# Patient Record
Sex: Female | Born: 1960 | Race: White | Hispanic: No | Marital: Married | Smoking: Never smoker
Health system: Southern US, Community
[De-identification: ages and names within clinical notes are randomized; demographics above are authoritative.]

## PROBLEM LIST (undated history)

## (undated) DIAGNOSIS — I10 Essential (primary) hypertension: Secondary | ICD-10-CM

## (undated) DIAGNOSIS — M1990 Unspecified osteoarthritis, unspecified site: Secondary | ICD-10-CM

## (undated) HISTORY — DX: Unspecified osteoarthritis, unspecified site: M19.90

## (undated) HISTORY — DX: Essential (primary) hypertension: I10

---

## 2000-08-31 HISTORY — PX: OTHER SURGICAL HISTORY: SHX169

## 2004-11-24 ENCOUNTER — Ambulatory Visit: Payer: Self-pay

## 2006-01-07 ENCOUNTER — Ambulatory Visit: Payer: Self-pay

## 2007-01-17 ENCOUNTER — Ambulatory Visit: Payer: Self-pay

## 2009-01-25 ENCOUNTER — Ambulatory Visit: Payer: Self-pay | Admitting: Internal Medicine

## 2009-02-18 ENCOUNTER — Ambulatory Visit: Payer: Self-pay | Admitting: Unknown Physician Specialty

## 2010-06-26 ENCOUNTER — Ambulatory Visit: Payer: Self-pay | Admitting: Family Medicine

## 2011-09-02 LAB — LIPID PANEL
Cholesterol: 168 mg/dL (ref 0–200)
HDL: 61 mg/dL (ref 35–70)
LDL Cholesterol: 86 mg/dL

## 2012-07-01 LAB — HM MAMMOGRAPHY: HM MAMMO: NORMAL

## 2012-07-01 LAB — HM PAP SMEAR: HM Pap smear: NORMAL

## 2012-08-31 HISTORY — PX: COLONOSCOPY: SHX174

## 2012-09-01 LAB — HM COLONOSCOPY: HM COLON: NORMAL

## 2013-08-09 LAB — TSH: TSH: 2.5 u[IU]/mL (ref <=5.90)

## 2013-08-09 LAB — BASIC METABOLIC PANEL
BUN: 16 mg/dL (ref 4–21)
Creatinine: 0.7 mg/dL (ref <=1.1)

## 2013-08-09 LAB — CBC AND DIFFERENTIAL: HEMOGLOBIN: 14.8 g/dL (ref 12.0–16.0)

## 2015-03-13 ENCOUNTER — Other Ambulatory Visit: Payer: Self-pay | Admitting: Internal Medicine

## 2015-03-13 ENCOUNTER — Encounter: Payer: Self-pay | Admitting: Internal Medicine

## 2015-03-13 DIAGNOSIS — I10 Essential (primary) hypertension: Secondary | ICD-10-CM | POA: Insufficient documentation

## 2015-03-13 DIAGNOSIS — M5136 Other intervertebral disc degeneration, lumbar region: Secondary | ICD-10-CM | POA: Insufficient documentation

## 2015-03-13 MED ORDER — MELOXICAM 15 MG PO TABS: 15.0000 mg | ORAL_TABLET | Freq: Every day | ORAL | Status: DC

## 2015-05-09 ENCOUNTER — Encounter: Payer: Self-pay | Admitting: Internal Medicine

## 2015-08-05 ENCOUNTER — Encounter: Payer: Self-pay | Admitting: Internal Medicine

## 2015-08-05 ENCOUNTER — Ambulatory Visit (INDEPENDENT_AMBULATORY_CARE_PROVIDER_SITE_OTHER): Payer: 59 | Admitting: Internal Medicine

## 2015-08-05 VITALS — BP 102/70 | HR 88 | Ht 61.5 in | Wt 171.0 lb

## 2015-08-05 DIAGNOSIS — Z114 Encounter for screening for human immunodeficiency virus [HIV]: Secondary | ICD-10-CM

## 2015-08-05 DIAGNOSIS — M5136 Other intervertebral disc degeneration, lumbar region: Secondary | ICD-10-CM | POA: Diagnosis not present

## 2015-08-05 DIAGNOSIS — Z0000 Encounter for general adult medical examination without abnormal findings: Secondary | ICD-10-CM | POA: Diagnosis not present

## 2015-08-05 DIAGNOSIS — I10 Essential (primary) hypertension: Secondary | ICD-10-CM

## 2015-08-05 DIAGNOSIS — Z1159 Encounter for screening for other viral diseases: Secondary | ICD-10-CM

## 2015-08-05 DIAGNOSIS — D485 Neoplasm of uncertain behavior of skin: Secondary | ICD-10-CM | POA: Diagnosis not present

## 2015-08-05 DIAGNOSIS — Z1239 Encounter for other screening for malignant neoplasm of breast: Secondary | ICD-10-CM | POA: Diagnosis not present

## 2015-08-05 LAB — POCT URINALYSIS DIPSTICK
BILIRUBIN UA: NEGATIVE
GLUCOSE UA: NEGATIVE
Ketones, UA: NEGATIVE
Leukocytes, UA: NEGATIVE
NITRITE UA: NEGATIVE
Protein, UA: NEGATIVE
RBC UA: NEGATIVE
Spec Grav, UA: 1.01
UROBILINOGEN UA: 0.2
pH, UA: 5

## 2015-08-05 NOTE — Patient Instructions (Signed)
Breast Self-Awareness Practicing breast self-awareness may pick up problems early, prevent significant medical complications, and possibly save your life. By practicing breast self-awareness, you can become familiar with how your breasts look and feel and if your breasts are changing. This allows you to notice changes early. It can also offer you some reassurance that your breast health is good. One way to learn what is normal for your breasts and whether your breasts are changing is to do a breast self-exam. If you find a lump or something that was not present in the past, it is best to contact your caregiver right away. Other findings that should be evaluated by your caregiver include nipple discharge, especially if it is bloody; skin changes or reddening; areas where the skin seems to be pulled in (retracted); or new lumps and bumps. Breast pain is seldom associated with cancer (malignancy), but should also be evaluated by a caregiver. HOW TO PERFORM A BREAST SELF-EXAM The best time to examine your breasts is 5-7 days after your menstrual period is over. During menstruation, the breasts are lumpier, and it may be more difficult to pick up changes. If you do not menstruate, have reached menopause, or had your uterus removed (hysterectomy), you should examine your breasts at regular intervals, such as monthly. If you are breastfeeding, examine your breasts after a feeding or after using a breast pump. Breast implants do not decrease the risk for lumps or tumors, so continue to perform breast self-exams as recommended. Talk to your caregiver about how to determine the difference between the implant and breast tissue. Also, talk about the amount of pressure you should use during the exam. Over time, you will become more familiar with the variations of your breasts and more comfortable with the exam. A breast self-exam requires you to remove all your clothes above the waist. 1. Look at your breasts and nipples.  Stand in front of a mirror in a room with good lighting. With your hands on your hips, push your hands firmly downward. Look for a difference in shape, contour, and size from one breast to the other (asymmetry). Asymmetry includes puckers, dips, or bumps. Also, look for skin changes, such as reddened or scaly areas on the breasts. Look for nipple changes, such as discharge, dimpling, repositioning, or redness. 2. Carefully feel your breasts. This is best done either in the shower or tub while using soapy water or when flat on your back. Place the arm (on the side of the breast you are examining) above your head. Use the pads (not the fingertips) of your three middle fingers on your opposite hand to feel your breasts. Start in the underarm area and use  inch (2 cm) overlapping circles to feel your breast. Use 3 different levels of pressure (light, medium, and firm pressure) at each circle before moving to the next circle. The light pressure is needed to feel the tissue closest to the skin. The medium pressure will help to feel breast tissue a little deeper, while the firm pressure is needed to feel the tissue close to the ribs. Continue the overlapping circles, moving downward over the breast until you feel your ribs below your breast. Then, move one finger-width towards the center of the body. Continue to use the  inch (2 cm) overlapping circles to feel your breast as you move slowly up toward the collar bone (clavicle) near the base of the neck. Continue the up and down exam using all 3 pressures until you reach the   middle of the chest. Do this with each breast, carefully feeling for lumps or changes. 3.  Keep a written record with breast changes or normal findings for each breast. By writing this information down, you do not need to depend only on memory for size, tenderness, or location. Write down where you are in your menstrual cycle, if you are still menstruating. Breast tissue can have some lumps or  thick tissue. However, see your caregiver if you find anything that concerns you.  SEEK MEDICAL CARE IF:  You see a change in shape, contour, or size of your breasts or nipples.   You see skin changes, such as reddened or scaly areas on the breasts or nipples.   You have an unusual discharge from your nipples.   You feel a new lump or unusually thick areas.    This information is not intended to replace advice given to you by your health care provider. Make sure you discuss any questions you have with your health care provider.   Document Released: 08/17/2005 Document Revised: 08/03/2012 Document Reviewed: 12/02/2011 Elsevier Interactive Patient Education 2016 [employer] Inc[employer]  

## 2015-08-05 NOTE — Progress Notes (Signed)
Date:  08/05/2015   Name:  ANNIECE DEPERALTA   DOB:  02-13-1961   MRN:  [id number]   Chief Complaint: Annual Exam AERICA DONICA is a 54 y.o. female who presents today for her Complete Annual Exam. She feels well. She reports exercising regularly. She reports she is sleeping fairly well. She has not had a mammogram in several years. She denies any breast complaints.   Hypertension This is a chronic problem. The current episode started more than 1 year ago. The problem is unchanged. The problem is controlled. Pertinent negatives include no chest pain, headaches, neck pain, palpitations or shortness of breath. There are no associated agents to hypertension. Past treatments include ACE inhibitors, diuretics and beta blockers. There are no compliance problems.   Back Pain This is a recurrent problem. The current episode started more than 1 year ago. The problem has been waxing and waning since onset. The pain is present in the lumbar spine. Pertinent negatives include no abdominal pain, chest pain, dysuria or headaches.  Rash This is a chronic problem. The problem is unchanged. The affected locations include the right arm and left arm. The rash is characterized by redness and scaling. She was exposed to nothing. Pertinent negatives include no cough, fatigue, shortness of breath or sore throat.   She also has joint pains in the hips and knees at times. She's been taking meloxicam which helps significantly. For more minor discomfort she takes Tylenol. She also sometimes takes Aleve but we discussed Aleve and Motrin being similar and she should stick with just the meloxicam for more severe discomfort.  Review of Systems  Constitutional: Negative for chills, diaphoresis, fatigue and unexpected weight change.  HENT: Negative for hearing loss, sore throat, tinnitus and trouble swallowing.   Eyes: Negative for visual disturbance.  Respiratory: Negative for cough, chest tightness and shortness of breath.    Cardiovascular: Negative for chest pain, palpitations and leg swelling.  Gastrointestinal: Negative for abdominal pain, constipation and blood in stool.  Genitourinary: Negative for dysuria, hematuria, vaginal bleeding, vaginal discharge and menstrual problem.  Musculoskeletal: Positive for back pain and arthralgias. Negative for joint swelling, neck pain and neck stiffness.  Skin: Positive for rash. Negative for color change.  Allergic/Immunologic: Negative for food allergies.  Neurological: Negative for tremors, light-headedness and headaches.  Hematological: Negative for adenopathy.  Psychiatric/Behavioral: Negative for sleep disturbance and dysphoric mood. The patient is not nervous/anxious.     Patient Active Problem List   Diagnosis Date Noted  . DDD (degenerative disc disease), lumbar 03/13/2015  . Essential (primary) hypertension 03/13/2015    Prior to Admission medications   Medication Sig Start Date End Date Taking? Authorizing Provider  lisinopril-hydrochlorothiazide (PRINZIDE,ZESTORETIC) 20-25 MG per tablet Take 0.5 tablets by mouth daily.  09/19/14  Yes Historical Provider, MD  meloxicam (MOBIC) 15 MG tablet Take 1 tablet (15 mg total) by mouth daily. 03/13/15  Yes Glean Hess, MD  metoprolol succinate (TOPROL-XL) 50 MG 24 hr tablet Take 1 tablet by mouth daily. 09/19/14  Yes Historical Provider, MD    Allergies  Allergen Reactions  . Penicillins Hives    Past Surgical History  Procedure Laterality Date  . Benign rectal mass  2002    Social History  Substance Use Topics  . Smoking status: Never Smoker   . Smokeless tobacco: None  . Alcohol Use: No    Medication list has been reviewed and updated.   Physical Exam  Constitutional: She is oriented to person, place, and  time. She appears well-developed and well-nourished. No distress.  HENT:  Head: Normocephalic and atraumatic.  Right Ear: Tympanic membrane and ear canal normal.  Left Ear: Tympanic  membrane and ear canal normal.  Nose: Right sinus exhibits no maxillary sinus tenderness. Left sinus exhibits no maxillary sinus tenderness.  Mouth/Throat: Uvula is midline and oropharynx is clear and moist.  Eyes: Conjunctivae and EOM are normal. Right eye exhibits no discharge. Left eye exhibits no discharge. No scleral icterus.  Neck: Normal range of motion. Carotid bruit is not present. No erythema present. No thyromegaly present.  Cardiovascular: Normal rate, regular rhythm, normal heart sounds and normal pulses.   Pulmonary/Chest: Effort normal. No respiratory distress. She has no wheezes. Right breast exhibits no mass, no nipple discharge, no skin change and no tenderness. Left breast exhibits no mass, no nipple discharge, no skin change and no tenderness.  Abdominal: Soft. Bowel sounds are normal. There is no hepatosplenomegaly. There is no tenderness. There is no CVA tenderness.  Genitourinary: Vagina normal and uterus normal. Rectal exam shows external hemorrhoid. There is no rash, tenderness or lesion on the right labia. There is no rash, tenderness or lesion on the left labia. Cervix exhibits no motion tenderness and no discharge. Right adnexum displays no mass, no tenderness and no fullness. Left adnexum displays no mass, no tenderness and no fullness.  Musculoskeletal: Normal range of motion.  Lymphadenopathy:    She has no cervical adenopathy.    She has no axillary adenopathy.  Neurological: She is alert and oriented to person, place, and time. She has normal reflexes. No cranial nerve deficit or sensory deficit.  Skin: Skin is warm and dry. Rash noted. Rash is maculopapular.  Scaly red non tender lesions scattered over both forearms extensor surface  Psychiatric: She has a normal mood and affect. Her speech is normal and behavior is normal. Thought content normal.  Nursing note and vitals reviewed.   BP 102/70 mmHg  Pulse 88  Ht 5' 1.5" (1.562 m)  Wt 171 lb (77.565 kg)  BMI  31.79 kg/m2  Assessment and Plan: 1. Annual physical exam Patient declines flu vaccine - POCT urinalysis dipstick - Pap IG and HPV (high risk) DNA detection - Lipid panel  2. Essential (primary) hypertension Controlled - CBC with Differential/Platelet - Comprehensive metabolic panel - TSH  3. Breast cancer screening Self breast exam counseling provided - MM DIGITAL SCREENING BILATERAL; Future  4. DDD (degenerative disc disease), lumbar Mild and intermittent Continue Tylenol or Mobic as needed  5. Neoplasm of uncertain behavior of skin Recommend dermatology evaluation  6. Encounter for screening for HIV - HIV antibody  7. Need for hepatitis C screening test - Hepatitis C antibody   Halina Maidens, MD [facility]  08/05/2015

## 2015-08-06 LAB — CBC WITH DIFFERENTIAL/PLATELET
BASOS: 1 %
Basophils Absolute: 0 10*3/uL (ref 0.0–0.2)
EOS (ABSOLUTE): 0.1 10*3/uL (ref 0.0–0.4)
EOS: 1 %
HEMATOCRIT: 44.6 % (ref 34.0–46.6)
HEMOGLOBIN: 15.2 g/dL (ref 11.1–15.9)
IMMATURE GRANS (ABS): 0 10*3/uL (ref 0.0–0.1)
IMMATURE GRANULOCYTES: 0 %
LYMPHS: 30 %
Lymphocytes Absolute: 1.7 10*3/uL (ref 0.7–3.1)
MCH: 31.9 pg (ref 26.6–33.0)
MCHC: 34.1 g/dL (ref 31.5–35.7)
MCV: 94 fL (ref 79–97)
Monocytes Absolute: 0.4 10*3/uL (ref 0.1–0.9)
Monocytes: 7 %
NEUTROS ABS: 3.3 10*3/uL (ref 1.4–7.0)
NEUTROS PCT: 61 %
Platelets: 257 10*3/uL (ref 150–379)
RBC: 4.76 x10E6/uL (ref 3.77–5.28)
RDW: 13.2 % (ref 12.3–15.4)
WBC: 5.5 10*3/uL (ref 3.4–10.8)

## 2015-08-06 LAB — HEPATITIS C ANTIBODY

## 2015-08-06 LAB — COMPREHENSIVE METABOLIC PANEL
A/G RATIO: 2 (ref 1.1–2.5)
ALBUMIN: 4.7 g/dL (ref 3.5–5.5)
ALT: 28 IU/L (ref 0–32)
AST: 27 IU/L (ref 0–40)
Alkaline Phosphatase: 57 IU/L (ref 39–117)
BUN / CREAT RATIO: 23 (ref 9–23)
BUN: 17 mg/dL (ref 6–24)
Bilirubin Total: 0.5 mg/dL (ref 0.0–1.2)
CALCIUM: 9.8 mg/dL (ref 8.7–10.2)
CO2: 25 mmol/L (ref 18–29)
Chloride: 99 mmol/L (ref 97–106)
Creatinine, Ser: 0.75 mg/dL (ref 0.57–1.00)
GFR, EST AFRICAN AMERICAN: 104 mL/min/{1.73_m2} (ref >59)
GFR, EST NON AFRICAN AMERICAN: 90 mL/min/{1.73_m2} (ref >59)
GLOBULIN, TOTAL: 2.3 g/dL (ref 1.5–4.5)
Glucose: 85 mg/dL (ref 65–99)
POTASSIUM: 4.4 mmol/L (ref 3.5–5.2)
SODIUM: 140 mmol/L (ref 136–144)
TOTAL PROTEIN: 7 g/dL (ref 6.0–8.5)

## 2015-08-06 LAB — LIPID PANEL
CHOL/HDL RATIO: 2.4 ratio (ref 0.0–4.4)
Cholesterol, Total: 187 mg/dL (ref 100–199)
HDL: 77 mg/dL (ref >39)
LDL Calculated: 95 mg/dL (ref 0–99)
Triglycerides: 73 mg/dL (ref 0–149)
VLDL Cholesterol Cal: 15 mg/dL (ref 5–40)

## 2015-08-06 LAB — HIV ANTIBODY (ROUTINE TESTING W REFLEX): HIV SCREEN 4TH GENERATION: NONREACTIVE

## 2015-08-06 LAB — TSH: TSH: 2.73 u[IU]/mL (ref 0.450–4.500)

## 2015-08-08 ENCOUNTER — Ambulatory Visit: Payer: Self-pay

## 2015-08-14 ENCOUNTER — Ambulatory Visit
Admission: RE | Admit: 2015-08-14 | Discharge: 2015-08-14 | Disposition: A | Discharge status: Home / Self Care | Payer: 59 | Source: Ambulatory Visit | Attending: Internal Medicine | Admitting: Internal Medicine

## 2015-08-14 DIAGNOSIS — Z1231 Encounter for screening mammogram for malignant neoplasm of breast: Secondary | ICD-10-CM | POA: Insufficient documentation

## 2015-08-14 DIAGNOSIS — Z1239 Encounter for other screening for malignant neoplasm of breast: Secondary | ICD-10-CM

## 2015-08-14 LAB — PAP IG AND HPV HIGH-RISK
HPV, high-risk: NEGATIVE
PAP SMEAR COMMENT: 0

## 2015-10-15 ENCOUNTER — Other Ambulatory Visit: Payer: Self-pay | Admitting: Internal Medicine

## 2015-10-24 ENCOUNTER — Other Ambulatory Visit: Payer: Self-pay | Admitting: Internal Medicine

## 2015-10-29 ENCOUNTER — Other Ambulatory Visit: Payer: Self-pay | Admitting: Internal Medicine

## 2016-08-06 ENCOUNTER — Encounter: Payer: Self-pay | Admitting: Internal Medicine

## 2016-10-29 ENCOUNTER — Other Ambulatory Visit: Payer: Self-pay | Admitting: Internal Medicine

## 2016-11-29 ENCOUNTER — Other Ambulatory Visit: Payer: Self-pay | Admitting: Internal Medicine

## 2016-12-05 ENCOUNTER — Encounter: Payer: Self-pay | Admitting: Internal Medicine

## 2016-12-07 ENCOUNTER — Ambulatory Visit (INDEPENDENT_AMBULATORY_CARE_PROVIDER_SITE_OTHER): Payer: BLUE CROSS/BLUE SHIELD | Admitting: Internal Medicine

## 2016-12-07 ENCOUNTER — Telehealth: Payer: Self-pay

## 2016-12-07 ENCOUNTER — Encounter: Payer: Self-pay | Admitting: Internal Medicine

## 2016-12-07 VITALS — BP 142/90 | HR 74 | Ht 62.0 in | Wt 169.0 lb

## 2016-12-07 DIAGNOSIS — I10 Essential (primary) hypertension: Secondary | ICD-10-CM | POA: Diagnosis not present

## 2016-12-07 DIAGNOSIS — M5136 Other intervertebral disc degeneration, lumbar region: Secondary | ICD-10-CM | POA: Diagnosis not present

## 2016-12-07 DIAGNOSIS — Z1231 Encounter for screening mammogram for malignant neoplasm of breast: Secondary | ICD-10-CM | POA: Diagnosis not present

## 2016-12-07 DIAGNOSIS — Z1239 Encounter for other screening for malignant neoplasm of breast: Secondary | ICD-10-CM

## 2016-12-07 DIAGNOSIS — Z0000 Encounter for general adult medical examination without abnormal findings: Secondary | ICD-10-CM

## 2016-12-07 LAB — POCT URINALYSIS DIPSTICK
Bilirubin, UA: NEGATIVE
Blood, UA: NEGATIVE
Glucose, UA: NEGATIVE
Ketones, UA: NEGATIVE
LEUKOCYTES UA: NEGATIVE
NITRITE UA: NEGATIVE
Protein, UA: NEGATIVE
Spec Grav, UA: 1.01 (ref 1.030–1.035)
Urobilinogen, UA: 0.2 (ref ?–2.0)
pH, UA: 5 (ref 5.0–8.0)

## 2016-12-07 NOTE — Progress Notes (Signed)
Date:  12/07/2016   Name:  Sarah Orr   DOB:  17-Apr-1961   MRN:  [id number]   Chief Complaint: Annual Exam and Hypertension Sarah Orr is a 56 y.o. female who presents today for her Complete Annual Exam. She feels well. She reports exercising some with walking. She reports she is sleeping well. She denies breast complaints.  Hypertension  This is a chronic problem. The problem is unchanged. The problem is controlled. Pertinent negatives include no chest pain, headaches, palpitations or shortness of breath. Past treatments include ACE inhibitors, beta blockers and diuretics.  She is taking metoprolol and 1/2 of lisinopril-hct 20/25.   Review of Systems  Constitutional: Negative for chills, fatigue and fever.  HENT: Negative for congestion, hearing loss, tinnitus, trouble swallowing and voice change.   Eyes: Negative for visual disturbance.  Respiratory: Negative for cough, chest tightness, shortness of breath and wheezing.   Cardiovascular: Negative for chest pain, palpitations and leg swelling.  Gastrointestinal: Negative for abdominal pain, constipation, diarrhea and vomiting.  Endocrine: Negative for polydipsia and polyuria.  Genitourinary: Negative for dysuria, frequency, genital sores, vaginal bleeding and vaginal discharge.  Musculoskeletal: Positive for back pain and gait problem. Negative for arthralgias and joint swelling.  Skin: Negative for color change and rash.  Neurological: Negative for dizziness, tremors, light-headedness and headaches.  Hematological: Negative for adenopathy. Does not bruise/bleed easily.  Psychiatric/Behavioral: Negative for dysphoric mood and sleep disturbance. The patient is not nervous/anxious.     Patient Active Problem List   Diagnosis Date Noted  . DDD (degenerative disc disease), lumbar 03/13/2015  . Essential (primary) hypertension 03/13/2015    Prior to Admission medications   Medication Sig Start Date End Date Taking?  Authorizing Provider  lisinopril-hydrochlorothiazide (PRINZIDE,ZESTORETIC) 20-25 MG tablet TAKE 1 TABLET BY MOUTH EVERY DAY 10/15/15  Yes Glean Hess, MD  meloxicam (MOBIC) 15 MG tablet TAKE 1 TABLET (15 MG TOTAL) BY MOUTH DAILY. 11/29/16  Yes Glean Hess, MD  metoprolol succinate (TOPROL-XL) 50 MG 24 hr tablet TAKE 1 TABLET BY MOUTH EVERY DAY 10/29/16  Yes Glean Hess, MD    Allergies  Allergen Reactions  . Penicillins Hives    Past Surgical History:  Procedure Laterality Date  . benign rectal mass  2002  . COLONOSCOPY  2014    Social History  Substance Use Topics  . Smoking status: Never Smoker  . Smokeless tobacco: Never Used  . Alcohol use No   Depression screen PHQ 2/9 12/07/2016  Decreased Interest 0  Down, Depressed, Hopeless 0  PHQ - 2 Score 0     Medication list has been reviewed and updated.   Physical Exam  Constitutional: She is oriented to person, place, and time. She appears well-developed and well-nourished. No distress.  HENT:  Head: Normocephalic and atraumatic.  Right Ear: Tympanic membrane and ear canal normal.  Left Ear: Tympanic membrane and ear canal normal.  Nose: Right sinus exhibits no maxillary sinus tenderness. Left sinus exhibits no maxillary sinus tenderness.  Mouth/Throat: Uvula is midline and oropharynx is clear and moist.  Eyes: Conjunctivae and EOM are normal. Right eye exhibits no discharge. Left eye exhibits no discharge. No scleral icterus.  Neck: Normal range of motion. Carotid bruit is not present. No erythema present. No thyromegaly present.  Cardiovascular: Normal rate, regular rhythm, normal heart sounds and normal pulses.   Pulmonary/Chest: Effort normal. No respiratory distress. She has no wheezes. Right breast exhibits no mass, no nipple discharge, no skin  change and no tenderness. Left breast exhibits no mass, no nipple discharge, no skin change and no tenderness.  Abdominal: Soft. Bowel sounds are normal. There is no  hepatosplenomegaly. There is no tenderness. There is no CVA tenderness.  Musculoskeletal: Normal range of motion. She exhibits no edema or tenderness.  Lymphadenopathy:    She has no cervical adenopathy.    She has no axillary adenopathy.  Neurological: She is alert and oriented to person, place, and time. She has normal reflexes. No cranial nerve deficit or sensory deficit.  Skin: Skin is warm, dry and intact. No rash noted.  Psychiatric: She has a normal mood and affect. Her speech is normal and behavior is normal. Thought content normal.  Nursing note and vitals reviewed.   BP (!) 142/90 (BP Location: Right Arm, Patient Position: Sitting, Cuff Size: Normal)   Pulse 74   Ht 5\' 2"  (1.575 m)   Wt 169 lb (76.7 kg)   SpO2 100%   BMI 30.91 kg/m   Assessment and Plan: 1. Annual physical exam Continue diet and exercise Colonoscopy is up to date - Lipid panel - POCT urinalysis dipstick  2. Breast cancer screening - MM DIGITAL SCREENING BILATERAL; Future  3. Essential (primary) hypertension Not controlled - take one whole lisinopril-hct daily - CBC with Differential/Platelet - Comprehensive metabolic panel - TSH  4. DDD (degenerative disc disease), lumbar Continue exercise and NSAIDS as needed   No orders of the defined types were placed in this encounter.   Halina Maidens, MD [facility]  12/07/2016

## 2016-12-07 NOTE — Patient Instructions (Signed)
Breast Self-Awareness Breast self-awareness means being familiar with how your breasts look and feel. It involves checking your breasts regularly and reporting any changes to your health care provider. Practicing breast self-awareness is important. A change in your breasts can be a sign of a serious medical problem. Being familiar with how your breasts look and feel allows you to find any problems early, when treatment is more likely to be successful. All women should practice breast self-awareness, including women who have had breast implants. How to do a breast self-exam One way to learn what is normal for your breasts and whether your breasts are changing is to do a breast self-exam. To do a breast self-exam: Look for Changes   1. Remove all the clothing above your waist. 2. Stand in front of a mirror in a room with good lighting. 3. Put your hands on your hips. 4. Push your hands firmly downward. 5. Compare your breasts in the mirror. Look for differences between them (asymmetry), such as:  Differences in shape.  Differences in size.  Puckers, dips, and bumps in one breast and not the other. 6. Look at each breast for changes in your skin, such as:  Redness.  Scaly areas. 7. Look for changes in your nipples, such as:  Discharge.  Bleeding.  Dimpling.  Redness.  A change in position. Feel for Changes   Carefully feel your breasts for lumps and changes. It is best to do this while lying on your back on the floor and again while sitting or standing in the shower or tub with soapy water on your skin. Feel each breast in the following way:  Place the arm on the side of the breast you are examining above your head.  Feel your breast with the other hand.  Start in the nipple area and make  inch (2 cm) overlapping circles to feel your breast. Use the pads of your three middle fingers to do this. Apply light pressure, then medium pressure, then firm pressure. The light pressure  will allow you to feel the tissue closest to the skin. The medium pressure will allow you to feel the tissue that is a little deeper. The firm pressure will allow you to feel the tissue close to the ribs.  Continue the overlapping circles, moving downward over the breast until you feel your ribs below your breast.  Move one finger-width toward the center of the body. Continue to use the  inch (2 cm) overlapping circles to feel your breast as you move slowly up toward your collarbone.  Continue the up and down exam using all three pressures until you reach your armpit. Write Down What You Find   Write down what is normal for each breast and any changes that you find. Keep a written record with breast changes or normal findings for each breast. By writing this information down, you do not need to depend only on memory for size, tenderness, or location. Write down where you are in your menstrual cycle, if you are still menstruating. If you are having trouble noticing differences in your breasts, do not get discouraged. With time you will become more familiar with the variations in your breasts and more comfortable with the exam. How often should I examine my breasts? Examine your breasts every month. If you are breastfeeding, the best time to examine your breasts is after a feeding or after using a breast pump. If you menstruate, the best time to examine your breasts is 5-7 days  after your period is over. During your period, your breasts are lumpier, and it may be more difficult to notice changes. When should I see my health care provider? See your health care provider if you notice:  A change in shape or size of your breasts or nipples.  A change in the skin of your breast or nipples, such as a reddened or scaly area.  Unusual discharge from your nipples.  A lump or thick area that was not there before.  Pain in your breasts.  Anything that concerns you. This information is not intended to  replace advice given to you by your health care provider. Make sure you discuss any questions you have with your health care provider. Document Released: 08/17/2005 Document Revised: 01/23/2016 Document Reviewed: 07/07/2015 Elsevier Interactive Patient Education  2017 [employer].

## 2016-12-08 ENCOUNTER — Other Ambulatory Visit: Payer: Self-pay | Admitting: Internal Medicine

## 2016-12-08 LAB — COMPREHENSIVE METABOLIC PANEL
A/G RATIO: 1.7 (ref 1.2–2.2)
ALK PHOS: 57 IU/L (ref 39–117)
ALT: 25 IU/L (ref 0–32)
AST: 23 IU/L (ref 0–40)
Albumin: 4.3 g/dL (ref 3.5–5.5)
BUN/Creatinine Ratio: 17 (ref 9–23)
BUN: 13 mg/dL (ref 6–24)
Bilirubin Total: 0.4 mg/dL (ref 0.0–1.2)
CALCIUM: 9.6 mg/dL (ref 8.7–10.2)
CO2: 25 mmol/L (ref 18–29)
CREATININE: 0.77 mg/dL (ref 0.57–1.00)
Chloride: 102 mmol/L (ref 96–106)
GFR calc Af Amer: 100 mL/min/{1.73_m2} (ref >59)
GFR, EST NON AFRICAN AMERICAN: 87 mL/min/{1.73_m2} (ref >59)
Globulin, Total: 2.6 g/dL (ref 1.5–4.5)
Glucose: 87 mg/dL (ref 65–99)
POTASSIUM: 4.1 mmol/L (ref 3.5–5.2)
Sodium: 142 mmol/L (ref 134–144)
Total Protein: 6.9 g/dL (ref 6.0–8.5)

## 2016-12-08 LAB — CBC WITH DIFFERENTIAL/PLATELET
Basophils Absolute: 0.1 10*3/uL (ref 0.0–0.2)
Basos: 1 %
EOS (ABSOLUTE): 0.1 10*3/uL (ref 0.0–0.4)
Eos: 1 %
Hematocrit: 44 % (ref 34.0–46.6)
Hemoglobin: 14.7 g/dL (ref 11.1–15.9)
IMMATURE GRANULOCYTES: 0 %
Immature Grans (Abs): 0 10*3/uL (ref 0.0–0.1)
Lymphocytes Absolute: 2.1 10*3/uL (ref 0.7–3.1)
Lymphs: 30 %
MCH: 31.7 pg (ref 26.6–33.0)
MCHC: 33.4 g/dL (ref 31.5–35.7)
MCV: 95 fL (ref 79–97)
MONOS ABS: 0.5 10*3/uL (ref 0.1–0.9)
Monocytes: 8 %
NEUTROS PCT: 60 %
Neutrophils Absolute: 4.2 10*3/uL (ref 1.4–7.0)
PLATELETS: 245 10*3/uL (ref 150–379)
RBC: 4.64 x10E6/uL (ref 3.77–5.28)
RDW: 13.2 % (ref 12.3–15.4)
WBC: 7 10*3/uL (ref 3.4–10.8)

## 2016-12-08 LAB — LIPID PANEL
CHOL/HDL RATIO: 2.8 ratio (ref 0.0–4.4)
CHOLESTEROL TOTAL: 185 mg/dL (ref 100–199)
HDL: 67 mg/dL (ref >39)
LDL Calculated: 97 mg/dL (ref 0–99)
TRIGLYCERIDES: 104 mg/dL (ref 0–149)
VLDL Cholesterol Cal: 21 mg/dL (ref 5–40)

## 2016-12-08 LAB — TSH: TSH: 3.4 u[IU]/mL (ref 0.450–4.500)

## 2016-12-08 NOTE — Telephone Encounter (Signed)
ERROR

## 2016-12-10 ENCOUNTER — Ambulatory Visit
Admission: RE | Admit: 2016-12-10 | Discharge: 2016-12-10 | Disposition: A | Discharge status: Home / Self Care | Payer: BLUE CROSS/BLUE SHIELD | Source: Ambulatory Visit | Attending: Internal Medicine | Admitting: Internal Medicine

## 2016-12-10 ENCOUNTER — Other Ambulatory Visit: Payer: Self-pay | Admitting: Internal Medicine

## 2016-12-10 DIAGNOSIS — Z1231 Encounter for screening mammogram for malignant neoplasm of breast: Secondary | ICD-10-CM | POA: Insufficient documentation

## 2016-12-10 DIAGNOSIS — Z1239 Encounter for other screening for malignant neoplasm of breast: Secondary | ICD-10-CM

## 2017-03-08 ENCOUNTER — Ambulatory Visit: Payer: Self-pay | Admitting: Internal Medicine

## 2017-06-04 ENCOUNTER — Other Ambulatory Visit: Payer: Self-pay | Admitting: Internal Medicine

## 2017-06-04 NOTE — Telephone Encounter (Signed)
Called lvm to schedule follow up.

## 2017-06-10 ENCOUNTER — Ambulatory Visit (INDEPENDENT_AMBULATORY_CARE_PROVIDER_SITE_OTHER): Payer: BLUE CROSS/BLUE SHIELD | Admitting: Internal Medicine

## 2017-06-10 ENCOUNTER — Encounter: Payer: Self-pay | Admitting: Internal Medicine

## 2017-06-10 VITALS — BP 122/70 | HR 84 | Ht 62.0 in | Wt 159.5 lb

## 2017-06-10 DIAGNOSIS — M5136 Other intervertebral disc degeneration, lumbar region: Secondary | ICD-10-CM | POA: Diagnosis not present

## 2017-06-10 DIAGNOSIS — Z23 Encounter for immunization: Secondary | ICD-10-CM

## 2017-06-10 DIAGNOSIS — I10 Essential (primary) hypertension: Secondary | ICD-10-CM | POA: Diagnosis not present

## 2017-06-10 NOTE — Patient Instructions (Signed)
Glucosamine-chondroitin for knees

## 2017-06-10 NOTE — Progress Notes (Signed)
Date:  06/10/2017   Name:  Sarah Orr   DOB:  [birth year]   MRN:  [id number]   Chief Complaint: Hypertension and Immunizations (Flu Vaccine) Hypertension  This is a chronic problem. The problem is controlled. Pertinent negatives include no chest pain, headaches, palpitations or shortness of breath. Past treatments include beta blockers and ACE inhibitors. The current treatment provides significant improvement.  Back Pain  This is a chronic problem. The problem occurs intermittently. The problem is unchanged. The pain is present in the lumbar spine. Pertinent negatives include no abdominal pain, chest pain, dysuria, fever, headaches, numbness, tingling or weakness. She has tried NSAIDs for the symptoms. The treatment provided significant relief.  She is having some knee pain as well after prolonged activity.  Mobic helps but hurts her stomach.    Review of Systems  Constitutional: Negative for chills, fatigue and fever.  HENT: Negative for congestion, hearing loss, tinnitus, trouble swallowing and voice change.   Eyes: Negative for visual disturbance.  Respiratory: Negative for cough, chest tightness, shortness of breath and wheezing.   Cardiovascular: Negative for chest pain, palpitations and leg swelling.  Gastrointestinal: Negative for abdominal pain, constipation, diarrhea and vomiting.  Endocrine: Negative for polydipsia and polyuria.  Genitourinary: Negative for dysuria, frequency, genital sores, vaginal bleeding and vaginal discharge.  Musculoskeletal: Positive for back pain and gait problem. Negative for arthralgias and joint swelling.  Skin: Negative for color change and rash.  Neurological: Negative for dizziness, tingling, tremors, weakness, light-headedness, numbness and headaches.  Hematological: Negative for adenopathy. Does not bruise/bleed easily.  Psychiatric/Behavioral: Negative for dysphoric mood and sleep disturbance. The patient is not nervous/anxious.      Patient Active Problem List   Diagnosis Date Noted  . DDD (degenerative disc disease), lumbar 03/13/2015  . Essential (primary) hypertension 03/13/2015    Prior to Admission medications   Medication Sig Start Date End Date Taking? Authorizing Provider  lisinopril-hydrochlorothiazide (PRINZIDE,ZESTORETIC) 20-25 MG tablet TAKE 1 TABLET BY MOUTH EVERY DAY 06/04/17   Sarah Hess, MD  meloxicam (MOBIC) 15 MG tablet TAKE 1 TABLET (15 MG TOTAL) BY MOUTH DAILY. 11/29/16   Sarah Hess, MD  metoprolol succinate (TOPROL-XL) 50 MG 24 hr tablet TAKE 1 TABLET BY MOUTH EVERY DAY 10/29/16   Sarah Hess, MD    Allergies  Allergen Reactions  . Penicillins Hives    Past Surgical History:  Procedure Laterality Date  . benign rectal mass  2002  . COLONOSCOPY  2014    Social History  Substance Use Topics  . Smoking status: Never Smoker  . Smokeless tobacco: Never Used  . Alcohol use No     Medication list has been reviewed and updated.  PHQ 2/9 Scores 12/07/2016  PHQ - 2 Score 0    Physical Exam  Constitutional: She is oriented to person, place, and time. She appears well-developed. No distress.  HENT:  Head: Normocephalic and atraumatic.  Neck: Normal range of motion. Neck supple.  Cardiovascular: Normal rate and normal heart sounds.   Pulmonary/Chest: Effort normal and breath sounds normal. No respiratory distress. She has no wheezes.  Musculoskeletal: She exhibits no edema.       Right knee: She exhibits no swelling and no effusion.       Left knee: She exhibits no swelling and no effusion.  Neurological: She is alert and oriented to person, place, and time. She has normal reflexes.  Skin: Skin is warm and dry. No rash noted.  Psychiatric: She  has a normal mood and affect. Her behavior is normal. Thought content normal.  Nursing note and vitals reviewed.   BP 122/70 (BP Location: Right Arm, Patient Position: Sitting, Cuff Size: Normal)   Pulse 84   Ht 5\' 2"   (1.575 m)   Wt 159 lb 8 oz (72.3 kg)   SpO2 97%   BMI 29.17 kg/m   Assessment and Plan: 1. Essential (primary) hypertension controlled  2. DDD (degenerative disc disease), lumbar Stable Try glucosamine for knee OA  3. Need for influenza vaccination   No orders of the defined types were placed in this encounter.   Partially dictated using Editor, commissioning. Any errors are unintentional.  Halina Maidens, MD [facility]  06/10/2017

## 2017-08-30 ENCOUNTER — Other Ambulatory Visit: Payer: Self-pay | Admitting: Internal Medicine

## 2017-10-28 ENCOUNTER — Other Ambulatory Visit: Payer: Self-pay | Admitting: Internal Medicine

## 2017-12-15 ENCOUNTER — Ambulatory Visit (INDEPENDENT_AMBULATORY_CARE_PROVIDER_SITE_OTHER): Payer: BLUE CROSS/BLUE SHIELD | Admitting: Internal Medicine

## 2017-12-15 ENCOUNTER — Encounter: Payer: Self-pay | Admitting: Internal Medicine

## 2017-12-15 VITALS — BP 102/68 | HR 81 | Ht 62.0 in | Wt 160.0 lb

## 2017-12-15 DIAGNOSIS — Z0001 Encounter for general adult medical examination with abnormal findings: Secondary | ICD-10-CM | POA: Diagnosis not present

## 2017-12-15 DIAGNOSIS — I10 Essential (primary) hypertension: Secondary | ICD-10-CM

## 2017-12-15 DIAGNOSIS — Z1239 Encounter for other screening for malignant neoplasm of breast: Secondary | ICD-10-CM

## 2017-12-15 DIAGNOSIS — M5136 Other intervertebral disc degeneration, lumbar region: Secondary | ICD-10-CM

## 2017-12-15 DIAGNOSIS — Z0000 Encounter for general adult medical examination without abnormal findings: Secondary | ICD-10-CM

## 2017-12-15 LAB — POCT URINALYSIS DIPSTICK
Bilirubin, UA: NEGATIVE
Glucose, UA: NEGATIVE
KETONES UA: NEGATIVE
Leukocytes, UA: NEGATIVE
NITRITE UA: NEGATIVE
Protein, UA: NEGATIVE
Spec Grav, UA: 1.015 (ref 1.010–1.025)
Urobilinogen, UA: 0.2 E.U./dL
pH, UA: 7 (ref 5.0–8.0)

## 2017-12-15 MED ORDER — MELOXICAM 15 MG PO TABS: ORAL_TABLET | ORAL | 0 refills | Status: DC

## 2017-12-15 NOTE — Progress Notes (Signed)
Date:  12/15/2017   Name:  Sarah Orr   DOB:  [birth year]   MRN:  [id number]   Chief Complaint: Annual Exam (Breast Exam. Last pap Neg HPV 2016.) Sarah Orr is a 57 y.o. female who presents today for her Complete Annual Exam. She feels well. She reports exercising walking regularly until she developed some back and left sided sciatica. She reports she is sleeping well. She is due for a mammogram.  Hypertension  This is a chronic problem. The problem is unchanged. The problem is controlled. Pertinent negatives include no chest pain, headaches, palpitations or shortness of breath. Past treatments include ACE inhibitors, beta blockers and diuretics. The current treatment provides significant improvement.  Back Pain  This is a new problem. The current episode started more than 1 month ago. The problem occurs intermittently. The pain is present in the lumbar spine. The quality of the pain is described as aching. The pain radiates to the left foot. The pain is mild. The symptoms are aggravated by bending and twisting. Pertinent negatives include no abdominal pain, chest pain, dysuria, fever or headaches. She has tried home exercises and NSAIDs (and Physical therapy) for the symptoms. The treatment provided moderate (slowly improving but has not started back with walking yet) relief.      Review of Systems  Constitutional: Negative for chills, fatigue and fever.  HENT: Negative for congestion, hearing loss, tinnitus, trouble swallowing and voice change.   Eyes: Negative for visual disturbance.  Respiratory: Negative for cough, chest tightness, shortness of breath and wheezing.   Cardiovascular: Negative for chest pain, palpitations and leg swelling.  Gastrointestinal: Negative for abdominal pain, constipation, diarrhea and vomiting.  Endocrine: Negative for polydipsia and polyuria.  Genitourinary: Negative for dysuria, frequency, genital sores, vaginal bleeding and vaginal  discharge.  Musculoskeletal: Positive for back pain. Negative for arthralgias, gait problem and joint swelling.  Skin: Negative for color change and rash.  Neurological: Negative for dizziness, tremors, light-headedness and headaches.  Hematological: Negative for adenopathy. Does not bruise/bleed easily.  Psychiatric/Behavioral: Negative for dysphoric mood and sleep disturbance. The patient is not nervous/anxious.     Patient Active Problem List   Diagnosis Date Noted  . DDD (degenerative disc disease), lumbar 03/13/2015  . Essential (primary) hypertension 03/13/2015    Prior to Admission medications   Medication Sig Start Date End Date Taking? Authorizing Provider  lisinopril-hydrochlorothiazide (PRINZIDE,ZESTORETIC) 20-25 MG tablet TAKE 1 TABLET BY MOUTH EVERY DAY 08/31/17  Yes Glean Hess, MD  meloxicam (MOBIC) 15 MG tablet TAKE 1 TABLET (15 MG TOTAL) BY MOUTH DAILY. Patient taking differently: TAKE 1 TABLET (15 MG TOTAL) BY MOUTH DAILY AS NEEDED. 11/29/16  Yes Glean Hess, MD  metoprolol succinate (TOPROL-XL) 50 MG 24 hr tablet TAKE 1 TABLET BY MOUTH EVERY DAY 10/28/17  Yes Glean Hess, MD    Allergies  Allergen Reactions  . Penicillins Hives    Past Surgical History:  Procedure Laterality Date  . benign rectal mass  2002  . COLONOSCOPY  2014    Social History   Tobacco Use  . Smoking status: Never Smoker  . Smokeless tobacco: Never Used  Substance Use Topics  . Alcohol use: No    Alcohol/week: 0.0 oz  . Drug use: No     Medication list has been reviewed and updated.  PHQ 2/9 Scores 12/15/2017 12/07/2016  PHQ - 2 Score 0 0  PHQ- 9 Score 0 -    Physical Exam  Constitutional:  She is oriented to person, place, and time. She appears well-developed and well-nourished. No distress.  HENT:  Head: Normocephalic and atraumatic.  Right Ear: Tympanic membrane and ear canal normal.  Left Ear: Tympanic membrane and ear canal normal.  Nose: Right sinus  exhibits no maxillary sinus tenderness. Left sinus exhibits no maxillary sinus tenderness.  Mouth/Throat: Uvula is midline and oropharynx is clear and moist.  Eyes: Conjunctivae and EOM are normal. Right eye exhibits no discharge. Left eye exhibits no discharge. No scleral icterus.  Neck: Normal range of motion. Carotid bruit is not present. No erythema present. No thyromegaly present.  Cardiovascular: Normal rate, regular rhythm, normal heart sounds and normal pulses.  Pulmonary/Chest: Effort normal. No respiratory distress. She has no wheezes. Right breast exhibits no mass, no nipple discharge, no skin change and no tenderness. Left breast exhibits no mass, no nipple discharge, no skin change and no tenderness.  Abdominal: Soft. Bowel sounds are normal. There is no hepatosplenomegaly. There is no tenderness. There is no CVA tenderness.  Musculoskeletal: Normal range of motion.       Right hip: Normal.       Left hip: Normal.       Right knee: Normal.       Left knee: Normal.  Lymphadenopathy:    She has no cervical adenopathy.    She has no axillary adenopathy.  Neurological: She is alert and oriented to person, place, and time. She has normal strength and normal reflexes. No cranial nerve deficit or sensory deficit.  Skin: Skin is warm, dry and intact. No rash noted.  Psychiatric: She has a normal mood and affect. Her speech is normal and behavior is normal. Thought content normal.  Nursing note and vitals reviewed.   BP 102/68   Pulse 81   Ht 5\' 2"  (1.575 m)   Wt 160 lb (72.6 kg)   SpO2 100%   BMI 29.26 kg/m   Assessment and Plan: 1. Annual physical exam Resume exercise gradually as tolerated - Lipid panel  2. Breast cancer screening Schedule at [facility]; Future  3. Essential (primary) hypertension controlled - CBC with Differential/Platelet - Comprehensive metabolic panel - POCT urinalysis dipstick - TSH  4. DDD (degenerative disc  disease), lumbar Continue Mobic PRN Continue daily stretching and strengthening Begin walking when able   Meds ordered this encounter  Medications  . meloxicam (MOBIC) 15 MG tablet    Sig: TAKE 1 TABLET (15 MG TOTAL) BY MOUTH DAILY AS NEEDED.    Dispense:  30 tablet    Refill:  0    Partially dictated using Editor, commissioning. Any errors are unintentional.  Halina Maidens, MD [facility]  12/15/2017

## 2017-12-15 NOTE — Patient Instructions (Signed)

## 2017-12-16 LAB — COMPREHENSIVE METABOLIC PANEL
A/G RATIO: 2 (ref 1.2–2.2)
ALBUMIN: 4.6 g/dL (ref 3.5–5.5)
ALT: 19 IU/L (ref 0–32)
AST: 23 IU/L (ref 0–40)
Alkaline Phosphatase: 54 IU/L (ref 39–117)
BUN / CREAT RATIO: 23 (ref 9–23)
BUN: 18 mg/dL (ref 6–24)
Bilirubin Total: 0.4 mg/dL (ref 0.0–1.2)
CO2: 25 mmol/L (ref 20–29)
CREATININE: 0.79 mg/dL (ref 0.57–1.00)
Calcium: 9.7 mg/dL (ref 8.7–10.2)
Chloride: 102 mmol/L (ref 96–106)
GFR, EST AFRICAN AMERICAN: 96 mL/min/{1.73_m2} (ref >59)
GFR, EST NON AFRICAN AMERICAN: 83 mL/min/{1.73_m2} (ref >59)
GLOBULIN, TOTAL: 2.3 g/dL (ref 1.5–4.5)
Glucose: 89 mg/dL (ref 65–99)
POTASSIUM: 4.5 mmol/L (ref 3.5–5.2)
SODIUM: 141 mmol/L (ref 134–144)
Total Protein: 6.9 g/dL (ref 6.0–8.5)

## 2017-12-16 LAB — CBC WITH DIFFERENTIAL/PLATELET
BASOS: 1 %
Basophils Absolute: 0.1 10*3/uL (ref 0.0–0.2)
EOS (ABSOLUTE): 0.1 10*3/uL (ref 0.0–0.4)
EOS: 1 %
HEMATOCRIT: 45.3 % (ref 34.0–46.6)
HEMOGLOBIN: 15.1 g/dL (ref 11.1–15.9)
IMMATURE GRANULOCYTES: 0 %
Immature Grans (Abs): 0 10*3/uL (ref 0.0–0.1)
LYMPHS ABS: 1.8 10*3/uL (ref 0.7–3.1)
Lymphs: 34 %
MCH: 31.9 pg (ref 26.6–33.0)
MCHC: 33.3 g/dL (ref 31.5–35.7)
MCV: 96 fL (ref 79–97)
MONOCYTES: 7 %
MONOS ABS: 0.4 10*3/uL (ref 0.1–0.9)
NEUTROS PCT: 57 %
Neutrophils Absolute: 3 10*3/uL (ref 1.4–7.0)
Platelets: 245 10*3/uL (ref 150–379)
RBC: 4.74 x10E6/uL (ref 3.77–5.28)
RDW: 13.2 % (ref 12.3–15.4)
WBC: 5.3 10*3/uL (ref 3.4–10.8)

## 2017-12-16 LAB — LIPID PANEL
CHOL/HDL RATIO: 2.8 ratio (ref 0.0–4.4)
Cholesterol, Total: 180 mg/dL (ref 100–199)
HDL: 65 mg/dL (ref >39)
LDL CALC: 98 mg/dL (ref 0–99)
TRIGLYCERIDES: 87 mg/dL (ref 0–149)
VLDL Cholesterol Cal: 17 mg/dL (ref 5–40)

## 2017-12-16 LAB — TSH: TSH: 3.69 u[IU]/mL (ref 0.450–4.500)

## 2018-01-11 ENCOUNTER — Ambulatory Visit
Admission: RE | Admit: 2018-01-11 | Discharge: 2018-01-11 | Disposition: A | Discharge status: Home / Self Care | Payer: BLUE CROSS/BLUE SHIELD | Source: Ambulatory Visit | Attending: Internal Medicine | Admitting: Internal Medicine

## 2018-01-11 DIAGNOSIS — Z1231 Encounter for screening mammogram for malignant neoplasm of breast: Secondary | ICD-10-CM | POA: Diagnosis not present

## 2018-01-11 DIAGNOSIS — Z1239 Encounter for other screening for malignant neoplasm of breast: Secondary | ICD-10-CM

## 2018-01-16 ENCOUNTER — Other Ambulatory Visit: Payer: Self-pay | Admitting: Internal Medicine

## 2018-03-16 ENCOUNTER — Other Ambulatory Visit: Payer: Self-pay | Admitting: Internal Medicine

## 2018-04-23 ENCOUNTER — Other Ambulatory Visit: Payer: Self-pay | Admitting: Internal Medicine

## 2018-06-15 ENCOUNTER — Encounter: Payer: Self-pay | Admitting: Internal Medicine

## 2018-06-15 ENCOUNTER — Ambulatory Visit (INDEPENDENT_AMBULATORY_CARE_PROVIDER_SITE_OTHER): Payer: BLUE CROSS/BLUE SHIELD | Admitting: Internal Medicine

## 2018-06-15 VITALS — BP 124/78 | HR 98 | Ht 62.0 in | Wt 166.0 lb

## 2018-06-15 DIAGNOSIS — M62838 Other muscle spasm: Secondary | ICD-10-CM | POA: Diagnosis not present

## 2018-06-15 DIAGNOSIS — Z23 Encounter for immunization: Secondary | ICD-10-CM | POA: Diagnosis not present

## 2018-06-15 DIAGNOSIS — H9313 Tinnitus, bilateral: Secondary | ICD-10-CM

## 2018-06-15 DIAGNOSIS — R42 Dizziness and giddiness: Secondary | ICD-10-CM

## 2018-06-15 DIAGNOSIS — I10 Essential (primary) hypertension: Secondary | ICD-10-CM | POA: Diagnosis not present

## 2018-06-15 MED ORDER — CYCLOBENZAPRINE HCL 5 MG PO TABS: 5.0000 mg | ORAL_TABLET | Freq: Every day | ORAL | 2 refills | Status: DC

## 2018-06-15 NOTE — Patient Instructions (Signed)
Stay hydrated  Use Flonase nasal spray daily for the next few weeks  See ENT if dizziness continues

## 2018-06-15 NOTE — Progress Notes (Signed)
Date:  06/15/2018   Name:  Sarah Orr   DOB:  1961-05-24   MRN:  [id number]   Chief Complaint: Hypertension; Immunizations (Reg dose flu shot.); and Dizziness (Started 3 weeks ago. Dizziness and lightheaded when moving head or bending down. Feeling off balance. Mother had vertigo. )  Hypertension  This is a chronic problem. The problem is controlled. Pertinent negatives include no chest pain, headaches, palpitations or shortness of breath. Past treatments include beta blockers, diuretics and ACE inhibitors. The current treatment provides significant improvement.   Tinnitus - she has has this for some time without any obvious hearing loss.  Has never had ENT evaluation.  Over the past 3 weeks has had some vertigo sx, when standing for a while in the kitchen or when turning her head quickly while driving.  No nausea or vomiting.  No new sinus sx - has mild chronic allergies.  She has not taken anything for sinus or allergies.  Arm pain - has intermittent burning pain in her left arm from the shoulder to the wrist.  Worse with certain positions at night.  No weakness or swelling, no injury.  She has noticed more stiffness in her neck and shoulder muscles.  Taking mobic intermittently but not using heat/ice or muscle relaxants.  Review of Systems  Constitutional: Negative for chills, diaphoresis, fatigue and fever.  HENT: Positive for sinus pressure and tinnitus. Negative for congestion, hearing loss and trouble swallowing.   Eyes: Negative for visual disturbance.  Respiratory: Negative for cough, chest tightness, shortness of breath and wheezing.   Cardiovascular: Negative for chest pain and palpitations.  Gastrointestinal: Negative for abdominal pain.  Musculoskeletal: Positive for myalgias and neck stiffness.  Allergic/Immunologic: Positive for environmental allergies.  Neurological: Positive for dizziness. Negative for weakness, light-headedness, numbness and headaches.    Psychiatric/Behavioral: Negative for sleep disturbance. The patient is not nervous/anxious.     Patient Active Problem List   Diagnosis Date Noted  . 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 Vertigo 06/15/2018  . Muscle spasms of neck 06/15/2018  . DDD (degenerative disc disease), lumbar 03/13/2015  . Essential (primary) hypertension 03/13/2015    Allergies  Allergen Reactions  . Penicillins Hives    Past Surgical History:  Procedure Laterality Date  . benign rectal mass  2002  . COLONOSCOPY  2014    Social History   Tobacco Use  . Smoking status: Never Smoker  . Smokeless tobacco: Never Used  Substance Use Topics  . Alcohol use: No    Alcohol/week: 0.0 standard drinks  . Drug use: No     Medication list has been reviewed and updated.  Current Meds  Medication Sig  . lisinopril-hydrochlorothiazide (PRINZIDE,ZESTORETIC) 20-25 MG tablet TAKE 1 TABLET BY MOUTH EVERY DAY  . meloxicam (MOBIC) 15 MG tablet TAKE 1 TABLET (15 MG TOTAL) BY MOUTH DAILY AS NEEDED.  . metoprolol succinate (TOPROL-XL) 50 MG 24 hr tablet TAKE 1 TABLET BY MOUTH EVERY DAY    PHQ 2/9 Scores 06/15/2018 12/15/2017 12/07/2016  PHQ - 2 Score 0 0 0  PHQ- 9 Score - 0 -    Physical Exam  Constitutional: She is oriented to person, place, and time. She appears well-developed. No distress.  HENT:  Head: Normocephalic and atraumatic.  Right Ear: Tympanic membrane and ear canal normal.  Left Ear: Tympanic membrane and ear canal normal.  Nose: Nose normal. Right sinus exhibits no maxillary sinus tenderness and no frontal sinus tenderness. Left sinus exhibits no maxillary sinus  tenderness and no frontal sinus tenderness.  Mouth/Throat: Mucous membranes are normal. No posterior oropharyngeal erythema.  Eyes: Pupils are equal, round, and reactive to light. Right eye exhibits nystagmus. Left eye exhibits nystagmus.  Cardiovascular: Normal rate and regular rhythm.  Pulmonary/Chest: Effort normal and breath  sounds normal. No respiratory distress. She has no wheezes. She has no rhonchi.  Musculoskeletal:       Left shoulder: Normal.       Left elbow: Normal.       Left wrist: Normal.       Cervical back: She exhibits decreased range of motion, bony tenderness and spasm.  Negative Tinel's and Phalen's Grip 5/5  Neurological: She is alert and oriented to person, place, and time.  Skin: Skin is warm and dry. No rash noted.  Psychiatric: She has a normal mood and affect. Her behavior is normal. Thought content normal.  Nursing note and vitals reviewed.   BP 124/78 (BP Location: Right Arm, Patient Position: Sitting, Cuff Size: Normal)   Pulse 98   Ht 5\' 2"  (1.575 m)   Wt 166 lb (75.3 kg)   SpO2 100%   BMI 30.36 kg/m   Assessment and Plan: 1. Essential (primary) hypertension controlled  2. Vertigo May be viral  Begin flonase If persistent, refer to ENT  3. Tinnitus aurium, bilateral Recommend ENT evaluation  4. Muscle spasms of neck Continue mobic daily, Heat to neck and shoulder - cyclobenzaprine (FLEXERIL) 5 MG tablet; Take 1 tablet (5 mg total) by mouth at bedtime.  Dispense: 30 tablet; Refill: 2  5. Need for immunization against influenza - Flu Vaccine QUAD 36+ mos IM   Partially dictated using Editor, commissioning. Any errors are unintentional.  Halina Maidens, MD [facility]  06/15/2018

## 2018-09-13 ENCOUNTER — Other Ambulatory Visit: Payer: Self-pay | Admitting: Internal Medicine

## 2018-10-19 ENCOUNTER — Other Ambulatory Visit: Payer: Self-pay | Admitting: Internal Medicine

## 2018-12-16 ENCOUNTER — Other Ambulatory Visit: Payer: Self-pay

## 2018-12-20 ENCOUNTER — Other Ambulatory Visit: Payer: Self-pay

## 2018-12-20 ENCOUNTER — Ambulatory Visit (INDEPENDENT_AMBULATORY_CARE_PROVIDER_SITE_OTHER): Payer: BLUE CROSS/BLUE SHIELD | Admitting: Internal Medicine

## 2018-12-20 ENCOUNTER — Encounter: Payer: Self-pay | Admitting: Internal Medicine

## 2018-12-20 VITALS — BP 122/78 | HR 86 | Ht 62.0 in | Wt 166.0 lb

## 2018-12-20 DIAGNOSIS — Z0000 Encounter for general adult medical examination without abnormal findings: Secondary | ICD-10-CM

## 2018-12-20 DIAGNOSIS — Z23 Encounter for immunization: Secondary | ICD-10-CM | POA: Diagnosis not present

## 2018-12-20 DIAGNOSIS — Z1231 Encounter for screening mammogram for malignant neoplasm of breast: Secondary | ICD-10-CM | POA: Diagnosis not present

## 2018-12-20 DIAGNOSIS — M5136 Other intervertebral disc degeneration, lumbar region: Secondary | ICD-10-CM | POA: Diagnosis not present

## 2018-12-20 DIAGNOSIS — I10 Essential (primary) hypertension: Secondary | ICD-10-CM | POA: Diagnosis not present

## 2018-12-20 LAB — POCT URINALYSIS DIPSTICK
Bilirubin, UA: NEGATIVE
Glucose, UA: NEGATIVE
Ketones, UA: NEGATIVE
Leukocytes, UA: NEGATIVE
Nitrite, UA: NEGATIVE
Protein, UA: NEGATIVE
Spec Grav, UA: 1.015 (ref 1.010–1.025)
Urobilinogen, UA: 0.2 E.U./dL
pH, UA: 6.5 (ref 5.0–8.0)

## 2018-12-20 NOTE — Progress Notes (Signed)
Date:  12/20/2018   Name:  Sarah Orr   DOB:  16-Jan-1961   MRN:  [id number]   Chief Complaint: Annual Exam (Breast Exam. Papsmear next year. ) Sarah Orr is a 58 y.o. female who presents today for her Complete Annual Exam. She feels well. She reports exercising walking. She reports she is sleeping well. She denies any breast issues.  Mammogram due next month. She is interested in the Shingrix vaccine.  Hypertension  This is a chronic problem. The problem is controlled. Pertinent negatives include no chest pain, headaches, palpitations or shortness of breath. Past treatments include ACE inhibitors and beta blockers. The current treatment provides significant improvement.  Back Pain  This is a chronic problem. The problem occurs daily. The pain is present in the lumbar spine and thoracic spine. The pain is mild. Pertinent negatives include no abdominal pain, chest pain, dysuria, fever, headaches, paresis, perianal numbness or weakness. She has tried NSAIDs and heat (and tylenol) for the symptoms. The treatment provided moderate relief.    Review of Systems  Constitutional: Negative for chills, fatigue and fever.  HENT: Negative for congestion, hearing loss, tinnitus, trouble swallowing and voice change.   Eyes: Negative for visual disturbance.  Respiratory: Negative for cough, chest tightness, shortness of breath and wheezing.   Cardiovascular: Negative for chest pain, palpitations and leg swelling.  Gastrointestinal: Negative for abdominal pain, blood in stool, constipation, diarrhea and vomiting.  Endocrine: Negative for polydipsia and polyuria.  Genitourinary: Negative for dysuria, frequency, genital sores, menstrual problem, vaginal bleeding and vaginal discharge.  Musculoskeletal: Positive for arthralgias and back pain. Negative for gait problem and joint swelling.  Skin: Negative for color change and rash.  Neurological: Negative for dizziness, tremors, weakness,  light-headedness and headaches.  Hematological: Negative for adenopathy. Does not bruise/bleed easily.  Psychiatric/Behavioral: Negative for dysphoric mood and sleep disturbance. The patient is not nervous/anxious.     Patient Active Problem List   Diagnosis Date Noted  . 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 Vertigo 06/15/2018  . Muscle spasms of neck 06/15/2018  . DDD (degenerative disc disease), lumbar 03/13/2015  . Essential (primary) hypertension 03/13/2015    Allergies  Allergen Reactions  . Penicillins Hives    Past Surgical History:  Procedure Laterality Date  . benign rectal mass  2002  . COLONOSCOPY  2014    Social History   Tobacco Use  . Smoking status: Never Smoker  . Smokeless tobacco: Never Used  Substance Use Topics  . Alcohol use: No    Alcohol/week: 0.0 standard drinks  . Drug use: No     Medication list has been reviewed and updated.  Current Meds  Medication Sig  . lisinopril-hydrochlorothiazide (PRINZIDE,ZESTORETIC) 20-25 MG tablet TAKE 1 TABLET BY MOUTH EVERY DAY  . meloxicam (MOBIC) 15 MG tablet TAKE 1 TABLET (15 MG TOTAL) BY MOUTH DAILY AS NEEDED.  . metoprolol succinate (TOPROL-XL) 50 MG 24 hr tablet TAKE 1 TABLET BY MOUTH EVERY DAY    PHQ 2/9 Scores 12/20/2018 06/15/2018 12/15/2017 12/07/2016  PHQ - 2 Score 0 0 0 0  PHQ- 9 Score - - 0 -    BP Readings from Last 3 Encounters:  12/20/18 122/78  06/15/18 124/78  12/15/17 102/68    Physical Exam Vitals signs and nursing note reviewed.  Constitutional:      General: She is not in acute distress.    Appearance: She is well-developed.  HENT:     Head: Normocephalic and atraumatic.  Right Ear: Tympanic membrane and ear canal normal.     Left Ear: Tympanic membrane and ear canal normal.     Nose:     Right Sinus: No maxillary sinus tenderness.     Left Sinus: No maxillary sinus tenderness.     Mouth/Throat:     Pharynx: Uvula midline.  Eyes:     General: No scleral icterus.        Right eye: No discharge.        Left eye: No discharge.     Conjunctiva/sclera: Conjunctivae normal.  Neck:     Musculoskeletal: Normal range of motion. No erythema.     Thyroid: No thyromegaly.     Vascular: No carotid bruit.  Cardiovascular:     Rate and Rhythm: Normal rate and regular rhythm.     Pulses: Normal pulses.     Heart sounds: Normal heart sounds.  Pulmonary:     Effort: Pulmonary effort is normal. No respiratory distress.     Breath sounds: No wheezing.  Chest:     Breasts:        Right: No mass, nipple discharge, skin change or tenderness.        Left: No mass, nipple discharge, skin change or tenderness.  Abdominal:     General: Bowel sounds are normal.     Palpations: Abdomen is soft.     Tenderness: There is no abdominal tenderness.  Musculoskeletal: Normal range of motion.     Lumbar back: She exhibits no bony tenderness and no spasm.  Lymphadenopathy:     Cervical: No cervical adenopathy.  Skin:    General: Skin is warm and dry.     Findings: No rash.  Neurological:     Mental Status: She is alert and oriented to person, place, and time.     Cranial Nerves: No cranial nerve deficit.     Sensory: Sensation is intact. No sensory deficit.     Gait: Gait is intact.     Deep Tendon Reflexes: Reflexes are normal and symmetric.  Psychiatric:        Speech: Speech normal.        Behavior: Behavior normal.        Thought Content: Thought content normal.     Wt Readings from Last 3 Encounters:  12/20/18 166 lb (75.3 kg)  06/15/18 166 lb (75.3 kg)  12/15/17 160 lb (72.6 kg)    BP 122/78   Pulse 86   Ht 5\' 2"  (1.575 m)   Wt 166 lb (75.3 kg)   SpO2 97%   BMI 30.36 kg/m   Assessment and Plan: 1. Annual physical exam Normal exam except for weight Continue healthy diet and regular exercise - Lipid panel - POCT urinalysis dipstick  2. Encounter for screening mammogram for breast cancer Schedule when able - MM 3D SCREEN BREAST BILATERAL; Future   3. Essential (primary) hypertension controlled - CBC with Differential/Platelet - Comprehensive metabolic panel - TSH  4. DDD (degenerative disc disease), lumbar Continue tylenol and mobic PRN  5. Need for shingles vaccine First dose today - Varicella-zoster vaccine IM   Partially dictated using Editor, commissioning. Any errors are unintentional.  Halina Maidens, MD [facility]  12/20/2018

## 2018-12-21 LAB — COMPREHENSIVE METABOLIC PANEL
ALT: 18 IU/L (ref 0–32)
AST: 18 IU/L (ref 0–40)
Albumin/Globulin Ratio: 2.2 (ref 1.2–2.2)
Albumin: 4.6 g/dL (ref 3.8–4.9)
Alkaline Phosphatase: 55 IU/L (ref 39–117)
BUN/Creatinine Ratio: 20 (ref 9–23)
BUN: 17 mg/dL (ref 6–24)
Bilirubin Total: 0.3 mg/dL (ref 0.0–1.2)
CO2: 25 mmol/L (ref 20–29)
Calcium: 9.7 mg/dL (ref 8.7–10.2)
Chloride: 104 mmol/L (ref 96–106)
Creatinine, Ser: 0.84 mg/dL (ref 0.57–1.00)
GFR calc Af Amer: 89 mL/min/{1.73_m2} (ref >59)
GFR calc non Af Amer: 77 mL/min/{1.73_m2} (ref >59)
Globulin, Total: 2.1 g/dL (ref 1.5–4.5)
Glucose: 92 mg/dL (ref 65–99)
Potassium: 4.4 mmol/L (ref 3.5–5.2)
Sodium: 143 mmol/L (ref 134–144)
Total Protein: 6.7 g/dL (ref 6.0–8.5)

## 2018-12-21 LAB — CBC WITH DIFFERENTIAL/PLATELET
Basophils Absolute: 0.1 10*3/uL (ref 0.0–0.2)
Basos: 1 %
EOS (ABSOLUTE): 0.1 10*3/uL (ref 0.0–0.4)
Eos: 2 %
Hematocrit: 44.6 % (ref 34.0–46.6)
Hemoglobin: 15.1 g/dL (ref 11.1–15.9)
Immature Grans (Abs): 0 10*3/uL (ref 0.0–0.1)
Immature Granulocytes: 0 %
Lymphocytes Absolute: 1.8 10*3/uL (ref 0.7–3.1)
Lymphs: 29 %
MCH: 31.9 pg (ref 26.6–33.0)
MCHC: 33.9 g/dL (ref 31.5–35.7)
MCV: 94 fL (ref 79–97)
Monocytes Absolute: 0.5 10*3/uL (ref 0.1–0.9)
Monocytes: 8 %
Neutrophils Absolute: 3.8 10*3/uL (ref 1.4–7.0)
Neutrophils: 60 %
Platelets: 246 10*3/uL (ref 150–450)
RBC: 4.73 x10E6/uL (ref 3.77–5.28)
RDW: 12 % (ref 11.7–15.4)
WBC: 6.3 10*3/uL (ref 3.4–10.8)

## 2018-12-21 LAB — LIPID PANEL
Chol/HDL Ratio: 2.8 ratio (ref 0.0–4.4)
Cholesterol, Total: 173 mg/dL (ref 100–199)
HDL: 61 mg/dL (ref >39)
LDL Calculated: 92 mg/dL (ref 0–99)
Triglycerides: 99 mg/dL (ref 0–149)
VLDL Cholesterol Cal: 20 mg/dL (ref 5–40)

## 2018-12-21 LAB — TSH: TSH: 2.98 u[IU]/mL (ref 0.450–4.500)

## 2019-03-10 ENCOUNTER — Other Ambulatory Visit: Payer: Self-pay | Admitting: Internal Medicine

## 2019-03-22 ENCOUNTER — Ambulatory Visit (INDEPENDENT_AMBULATORY_CARE_PROVIDER_SITE_OTHER): Payer: BC Managed Care – PPO

## 2019-03-22 ENCOUNTER — Other Ambulatory Visit: Payer: Self-pay

## 2019-03-22 DIAGNOSIS — Z23 Encounter for immunization: Secondary | ICD-10-CM | POA: Diagnosis not present

## 2019-04-12 ENCOUNTER — Other Ambulatory Visit: Payer: Self-pay | Admitting: Internal Medicine

## 2019-05-17 IMAGING — MG MM DIGITAL SCREENING BILAT W/ TOMO W/ CAD
6 of 9 series · 6 of 25 positions shown · non-contrast
Comparison: Previous exam(s).

CLINICAL DATA: Screening.

EXAM:
DIGITAL SCREENING BILATERAL MAMMOGRAM WITH TOMO AND CAD

[R CC]
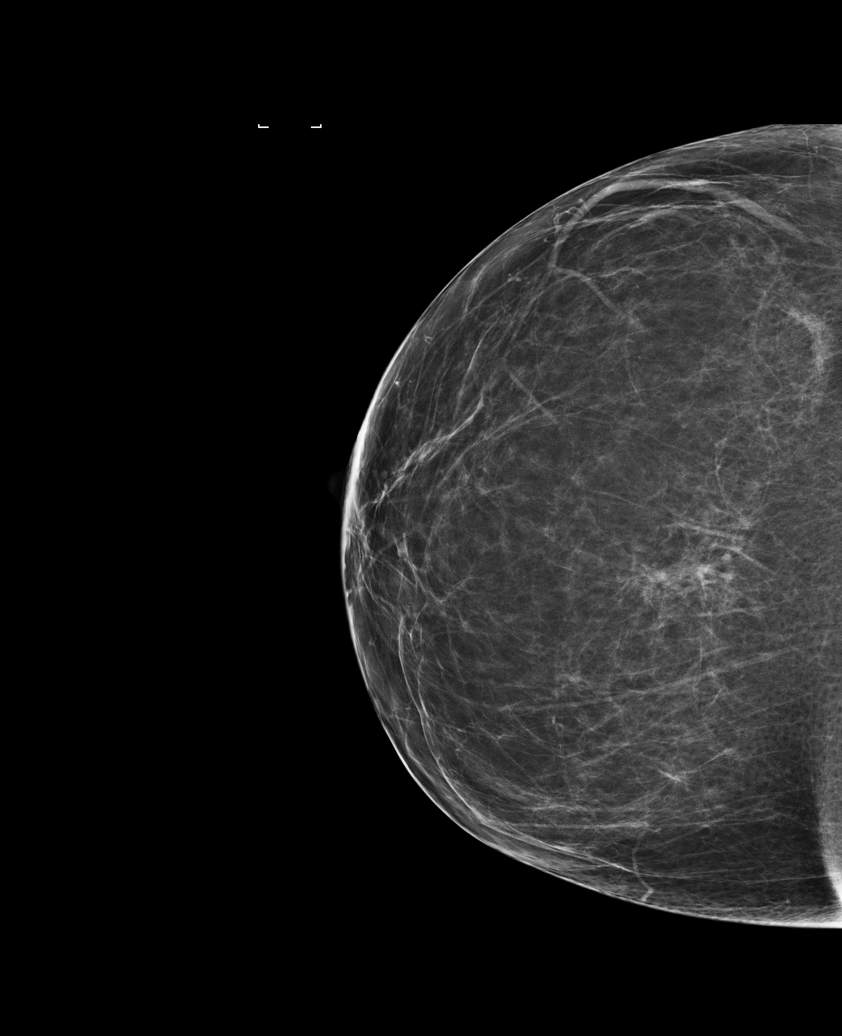

[R MLO synth-2D]
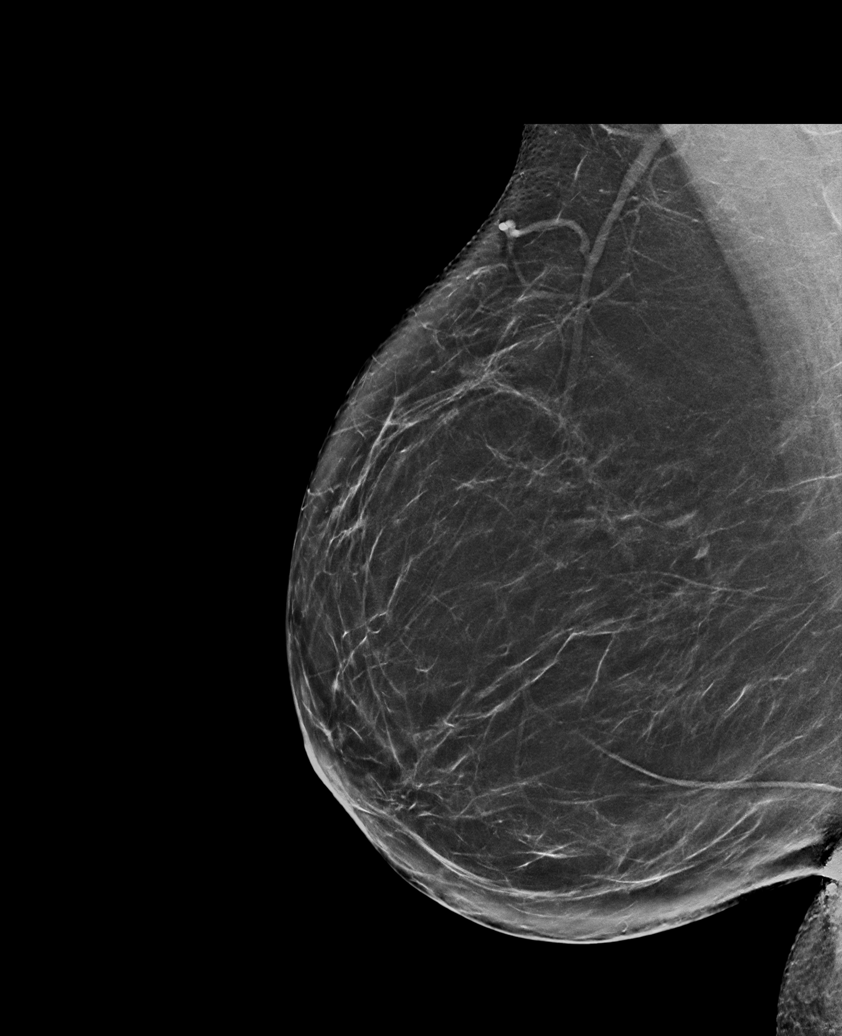

[L CC synth-2D]
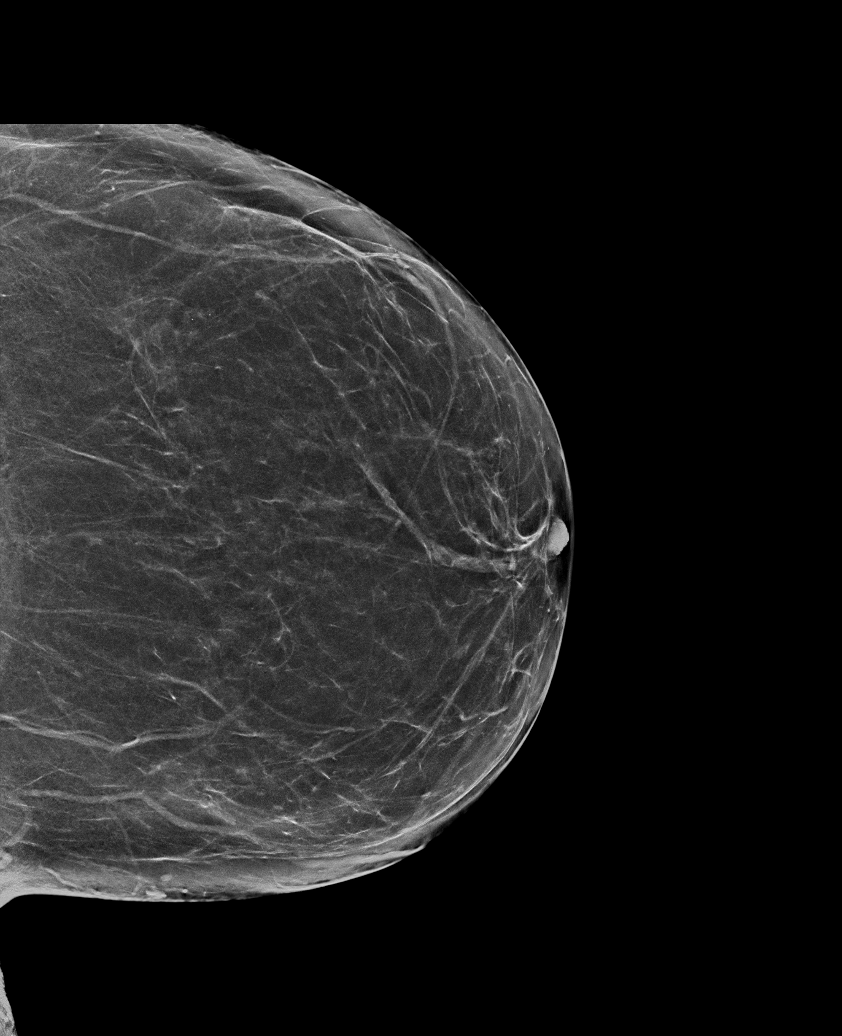

[L MLO synth-2D]
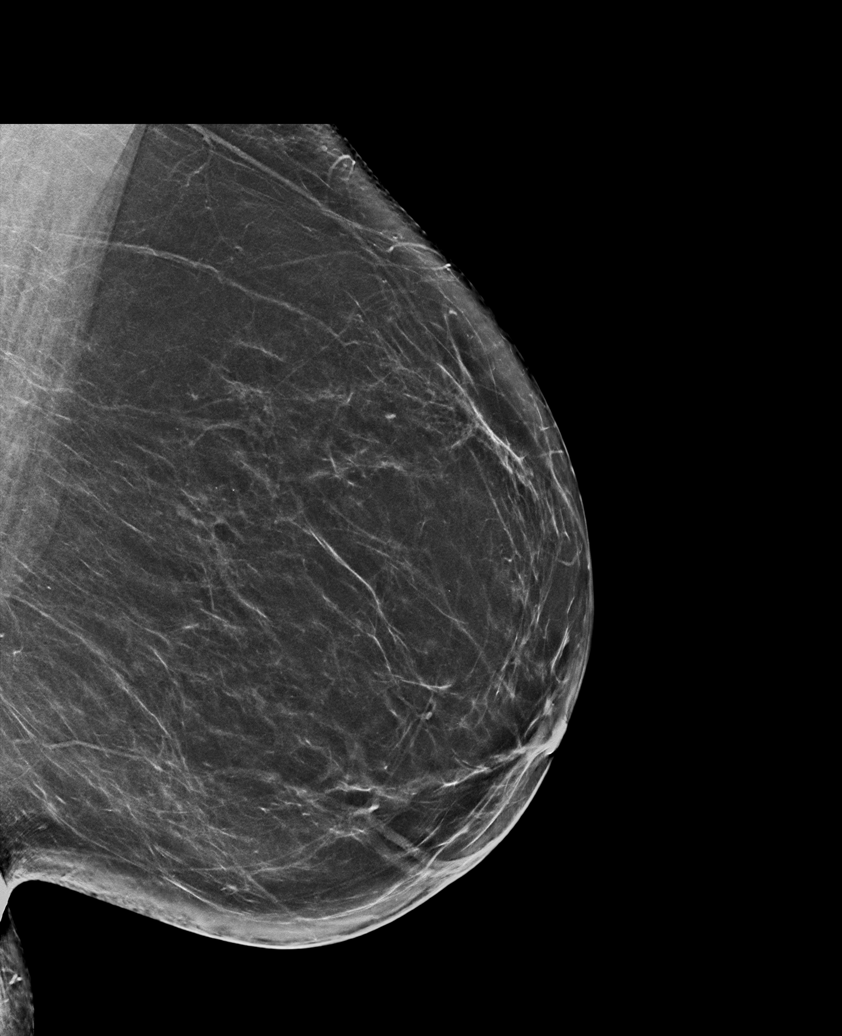

[R CC synth-2D]
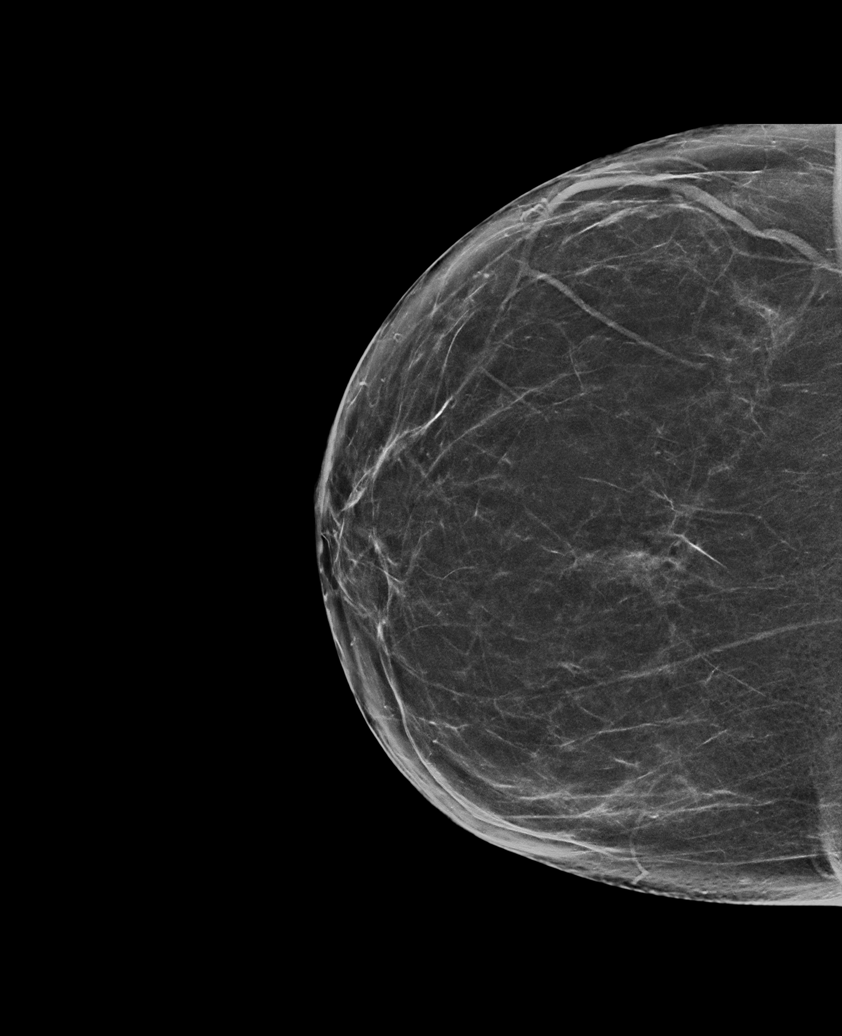

[L MLO tomo · tomo slice 39/76.0]
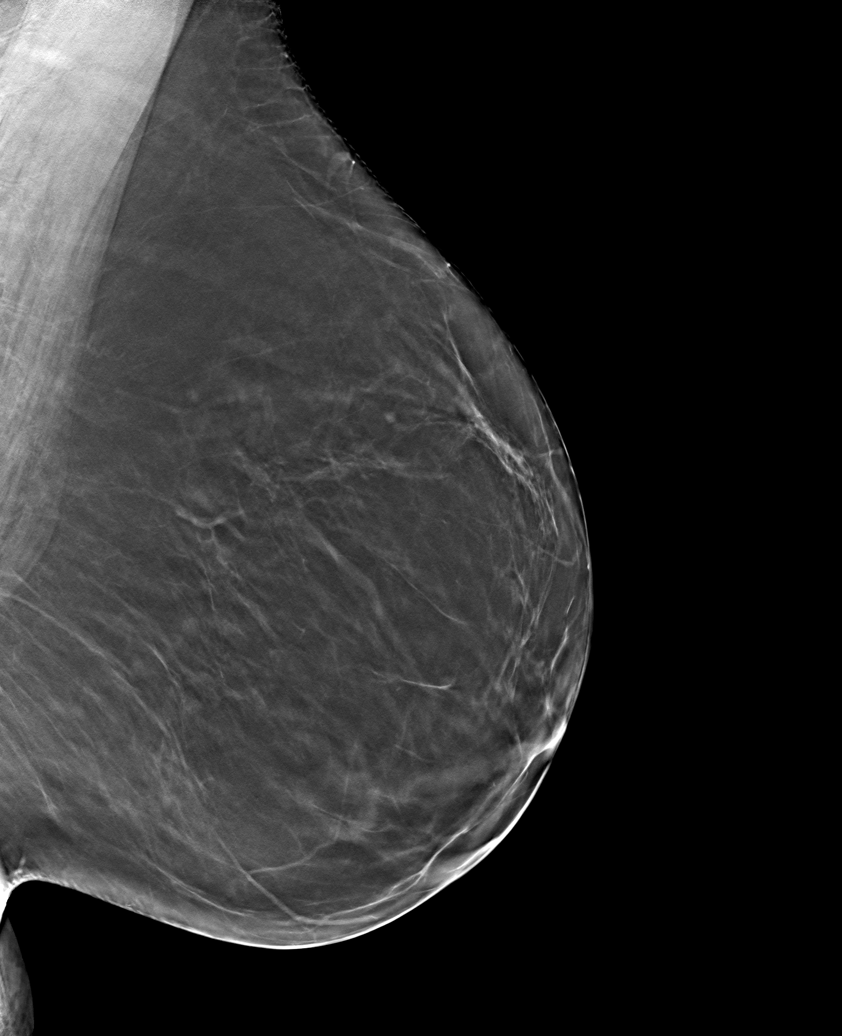

[6 of 25 positions shown; findings below may reference images not displayed]

ACR Breast Density Category b: There are scattered areas of
fibroglandular density.
FINDINGS: There are no findings suspicious for malignancy. Images were
processed with CAD.
IMPRESSION: No mammographic evidence of malignancy. A result letter of this
screening mammogram will be mailed directly to the patient.

RECOMMENDATION:
Screening mammogram in one year. (Code:[id number])

BI-RADS CATEGORY  1: Negative.

## 2019-09-07 ENCOUNTER — Other Ambulatory Visit: Payer: Self-pay | Admitting: Internal Medicine

## 2019-10-08 ENCOUNTER — Other Ambulatory Visit: Payer: Self-pay | Admitting: Internal Medicine

## 2019-12-21 ENCOUNTER — Other Ambulatory Visit: Payer: Self-pay

## 2019-12-21 ENCOUNTER — Ambulatory Visit (INDEPENDENT_AMBULATORY_CARE_PROVIDER_SITE_OTHER): Payer: BC Managed Care – PPO | Admitting: Internal Medicine

## 2019-12-21 ENCOUNTER — Encounter: Payer: Self-pay | Admitting: Internal Medicine

## 2019-12-21 VITALS — BP 118/70 | HR 84 | Temp 98.8°F | Ht 62.0 in | Wt 164.0 lb

## 2019-12-21 DIAGNOSIS — Z1231 Encounter for screening mammogram for malignant neoplasm of breast: Secondary | ICD-10-CM

## 2019-12-21 DIAGNOSIS — I10 Essential (primary) hypertension: Secondary | ICD-10-CM | POA: Diagnosis not present

## 2019-12-21 DIAGNOSIS — Z0000 Encounter for general adult medical examination without abnormal findings: Secondary | ICD-10-CM

## 2019-12-21 LAB — POCT URINALYSIS DIPSTICK
Bilirubin, UA: NEGATIVE
Blood, UA: NEGATIVE
Glucose, UA: NEGATIVE
Ketones, UA: NEGATIVE
Leukocytes, UA: NEGATIVE
Nitrite, UA: NEGATIVE
Protein, UA: NEGATIVE
Spec Grav, UA: 1.01 (ref 1.010–1.025)
Urobilinogen, UA: 0.2 E.U./dL
pH, UA: 6 (ref 5.0–8.0)

## 2019-12-21 NOTE — Progress Notes (Signed)
Date:  12/21/2019   Name:  Sarah Orr   DOB:  March 10, 1961   MRN:  [id number]   Chief Complaint: Annual Exam (Breast Exam. No pap.) Sarah Orr is a 59 y.o. female who presents today for her Complete Annual Exam. She feels well. She reports exercising walking and yard work. She reports she is sleeping fairly well. Denies breast issues.  Mammogram  12/2017 Pap  08/2015 neg with cotesting Colonoscopy  08/2012 Immunization History  Administered Date(s) Administered  . Influenza,inj,Quad PF,6+ Mos 06/10/2017, 06/15/2018  . Influenza-Unspecified 06/06/2019  . Moderna SARS-COVID-2 Vaccination 11/19/2019, 12/17/2019  . Zoster Recombinat (Shingrix) 12/20/2018, 03/22/2019    Hypertension This is a chronic problem. The problem is controlled. Pertinent negatives include no chest pain, headaches, palpitations or shortness of breath. Past treatments include diuretics, beta blockers and ACE inhibitors. The current treatment provides significant improvement.    Lab Results  Component Value Date   CREATININE 0.84 12/20/2018   BUN 17 12/20/2018   NA 143 12/20/2018   K 4.4 12/20/2018   CL 104 12/20/2018   CO2 25 12/20/2018   Lab Results  Component Value Date   CHOL 173 12/20/2018   HDL 61 12/20/2018   LDLCALC 92 12/20/2018   TRIG 99 12/20/2018   CHOLHDL 2.8 12/20/2018   Lab Results  Component Value Date   TSH 2.980 12/20/2018   No results found for: HGBA1C Lab Results  Component Value Date   WBC 6.3 12/20/2018   HGB 15.1 12/20/2018   HCT 44.6 12/20/2018   MCV 94 12/20/2018   PLT 246 12/20/2018   Lab Results  Component Value Date   ALT 18 12/20/2018   AST 18 12/20/2018   ALKPHOS 55 12/20/2018   BILITOT 0.3 12/20/2018     Review of Systems  Constitutional: Negative for chills, fatigue and fever.  HENT: Positive for congestion and tinnitus (intermittent both ears). Negative for hearing loss, trouble swallowing and voice change.   Eyes: Negative for visual  disturbance.  Respiratory: Negative for cough, chest tightness, shortness of breath and wheezing.   Cardiovascular: Negative for chest pain, palpitations and leg swelling.  Gastrointestinal: Negative for abdominal pain, constipation, diarrhea and vomiting.  Endocrine: Negative for polydipsia and polyuria.  Genitourinary: Negative for dysuria, frequency, genital sores, vaginal bleeding and vaginal discharge.  Musculoskeletal: Negative for arthralgias, gait problem and joint swelling.  Skin: Negative for color change and rash.  Allergic/Immunologic: Positive for environmental allergies.  Neurological: Negative for dizziness, tremors, light-headedness and headaches.  Hematological: Negative for adenopathy. Does not bruise/bleed easily.  Psychiatric/Behavioral: Negative for dysphoric mood and sleep disturbance. The patient is not nervous/anxious.     Patient Active Problem List   Diagnosis Date Noted  . 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 Vertigo 06/15/2018  . Muscle spasms of neck 06/15/2018  . DDD (degenerative disc disease), lumbar 03/13/2015  . Essential (primary) hypertension 03/13/2015    Allergies  Allergen Reactions  . Penicillins Hives    Past Surgical History:  Procedure Laterality Date  . benign rectal mass  2002  . COLONOSCOPY  2014    Social History   Tobacco Use  . Smoking status: Never Smoker  . Smokeless tobacco: Never Used  Substance Use Topics  . Alcohol use: No    Alcohol/week: 0.0 standard drinks  . Drug use: No     Medication list has been reviewed and updated.  Current Meds  Medication Sig  . lisinopril-hydrochlorothiazide (ZESTORETIC) 20-25 MG tablet TAKE 1 TABLET  BY MOUTH EVERY DAY  . meloxicam (MOBIC) 15 MG tablet TAKE 1 TABLET (15 MG TOTAL) BY MOUTH DAILY AS NEEDED.  . metoprolol succinate (TOPROL-XL) 50 MG 24 hr tablet TAKE 1 TABLET BY MOUTH EVERY DAY    PHQ 2/9 Scores 12/21/2019 12/20/2018 06/15/2018 12/15/2017  PHQ - 2 Score 0 0 0 0    PHQ- 9 Score 0 - - 0    BP Readings from Last 3 Encounters:  12/21/19 118/70  12/20/18 122/78  06/15/18 124/78    Physical Exam Vitals and nursing note reviewed.  Constitutional:      General: She is not in acute distress.    Appearance: She is well-developed.  HENT:     Head: Normocephalic and atraumatic.     Right Ear: Tympanic membrane and ear canal normal.     Left Ear: Tympanic membrane and ear canal normal.     Nose:     Right Sinus: No maxillary sinus tenderness or frontal sinus tenderness.     Left Sinus: No maxillary sinus tenderness or frontal sinus tenderness.  Eyes:     General: No scleral icterus.       Right eye: No discharge.        Left eye: No discharge.     Conjunctiva/sclera: Conjunctivae normal.  Neck:     Thyroid: No thyromegaly.     Vascular: No carotid bruit.  Cardiovascular:     Rate and Rhythm: Normal rate and regular rhythm.     Pulses: Normal pulses.     Heart sounds: Normal heart sounds.  Pulmonary:     Effort: Pulmonary effort is normal. No respiratory distress.     Breath sounds: No wheezing.  Chest:     Breasts:        Right: No mass, nipple discharge, skin change or tenderness.        Left: No mass, nipple discharge, skin change or tenderness.  Abdominal:     General: Bowel sounds are normal.     Palpations: Abdomen is soft.     Tenderness: There is no abdominal tenderness.  Musculoskeletal:        General: Normal range of motion.     Cervical back: Normal range of motion. No erythema.     Right lower leg: No edema.     Left lower leg: No edema.  Lymphadenopathy:     Cervical: No cervical adenopathy.  Skin:    General: Skin is warm and dry.     Capillary Refill: Capillary refill takes less than 2 seconds.     Findings: No rash.  Neurological:     General: No focal deficit present.     Mental Status: She is alert and oriented to person, place, and time.     Cranial Nerves: No cranial nerve deficit.     Sensory: No sensory  deficit.     Deep Tendon Reflexes: Reflexes are normal and symmetric.  Psychiatric:        Speech: Speech normal.        Behavior: Behavior normal.        Thought Content: Thought content normal.     Wt Readings from Last 3 Encounters:  12/21/19 164 lb (74.4 kg)  12/20/18 166 lb (75.3 kg)  06/15/18 166 lb (75.3 kg)    BP 118/70   Pulse 84   Temp 98.8 F (37.1 C) (Temporal)   Ht 5\' 2"  (1.575 m)   Wt 164 lb (74.4 kg)   SpO2 97%  BMI 30.00 kg/m   Assessment and Plan: 1. Annual physical exam Normal exam Pap smear due next year Colonoscopy due 2024 Continue healthy diet, exercise - Lipid panel - POCT urinalysis dipstick  2. Encounter for screening mammogram for breast cancer To be scheduled at [facility]; Future  3. Essential (primary) hypertension Clinically stable exam with well controlled BP. Tolerating medications without side effects at this time. Pt to continue current regimen and low sodium diet; benefits of regular exercise as able discussed. - CBC with Differential/Platelet - Comprehensive metabolic panel - TSH   Partially dictated using Editor, commissioning. Any errors are unintentional.  Halina Maidens, MD [facility]  12/21/2019

## 2019-12-22 LAB — CBC WITH DIFFERENTIAL/PLATELET
Basophils Absolute: 0.1 10*3/uL (ref 0.0–0.2)
Basos: 2 %
EOS (ABSOLUTE): 0.1 10*3/uL (ref 0.0–0.4)
Eos: 2 %
Hematocrit: 43.8 % (ref 34.0–46.6)
Hemoglobin: 15.2 g/dL (ref 11.1–15.9)
Immature Grans (Abs): 0 10*3/uL (ref 0.0–0.1)
Immature Granulocytes: 0 %
Lymphocytes Absolute: 1.6 10*3/uL (ref 0.7–3.1)
Lymphs: 33 %
MCH: 31.9 pg (ref 26.6–33.0)
MCHC: 34.7 g/dL (ref 31.5–35.7)
MCV: 92 fL (ref 79–97)
Monocytes Absolute: 0.5 10*3/uL (ref 0.1–0.9)
Monocytes: 10 %
Neutrophils Absolute: 2.5 10*3/uL (ref 1.4–7.0)
Neutrophils: 53 %
Platelets: 243 10*3/uL (ref 150–450)
RBC: 4.77 x10E6/uL (ref 3.77–5.28)
RDW: 12 % (ref 11.7–15.4)
WBC: 4.7 10*3/uL (ref 3.4–10.8)

## 2019-12-22 LAB — COMPREHENSIVE METABOLIC PANEL
ALT: 19 IU/L (ref 0–32)
AST: 21 IU/L (ref 0–40)
Albumin/Globulin Ratio: 1.7 (ref 1.2–2.2)
Albumin: 4.3 g/dL (ref 3.8–4.9)
Alkaline Phosphatase: 58 IU/L (ref 39–117)
BUN/Creatinine Ratio: 20 (ref 9–23)
BUN: 15 mg/dL (ref 6–24)
Bilirubin Total: 0.4 mg/dL (ref 0.0–1.2)
CO2: 26 mmol/L (ref 20–29)
Calcium: 10.3 mg/dL — ABNORMAL HIGH (ref 8.7–10.2)
Chloride: 99 mmol/L (ref 96–106)
Creatinine, Ser: 0.75 mg/dL (ref 0.57–1.00)
GFR calc Af Amer: 101 mL/min/{1.73_m2} (ref >59)
GFR calc non Af Amer: 88 mL/min/{1.73_m2} (ref >59)
Globulin, Total: 2.6 g/dL (ref 1.5–4.5)
Glucose: 95 mg/dL (ref 65–99)
Potassium: 4.2 mmol/L (ref 3.5–5.2)
Sodium: 138 mmol/L (ref 134–144)
Total Protein: 6.9 g/dL (ref 6.0–8.5)

## 2019-12-22 LAB — LIPID PANEL
Chol/HDL Ratio: 2.5 ratio (ref 0.0–4.4)
Cholesterol, Total: 178 mg/dL (ref 100–199)
HDL: 72 mg/dL (ref >39)
LDL Chol Calc (NIH): 93 mg/dL (ref 0–99)
Triglycerides: 72 mg/dL (ref 0–149)
VLDL Cholesterol Cal: 13 mg/dL (ref 5–40)

## 2019-12-22 LAB — TSH: TSH: 3.61 u[IU]/mL (ref 0.450–4.500)

## 2020-03-04 ENCOUNTER — Other Ambulatory Visit: Payer: Self-pay | Admitting: Internal Medicine

## 2020-09-10 ENCOUNTER — Other Ambulatory Visit: Payer: Self-pay | Admitting: Internal Medicine

## 2020-10-04 ENCOUNTER — Other Ambulatory Visit: Payer: Self-pay | Admitting: Internal Medicine

## 2020-10-04 NOTE — Telephone Encounter (Signed)
Requested medications are due for refill today yes  Requested medications are on the active medication list yes  Last refill 11/6  Last visit 11/2019  Future visit scheduled 11/2020  Notes to clinic Failed protocol due to no valid visit within 6  months.

## 2020-11-26 ENCOUNTER — Other Ambulatory Visit: Payer: Self-pay | Admitting: Internal Medicine

## 2020-11-26 DIAGNOSIS — Z1231 Encounter for screening mammogram for malignant neoplasm of breast: Secondary | ICD-10-CM

## 2020-11-28 ENCOUNTER — Other Ambulatory Visit: Payer: Self-pay

## 2020-11-28 ENCOUNTER — Ambulatory Visit
Admission: RE | Admit: 2020-11-28 | Discharge: 2020-11-28 | Disposition: A | Discharge status: Home / Self Care | Payer: BC Managed Care – PPO | Source: Ambulatory Visit | Attending: Internal Medicine | Admitting: Internal Medicine

## 2020-11-28 DIAGNOSIS — Z1231 Encounter for screening mammogram for malignant neoplasm of breast: Secondary | ICD-10-CM | POA: Insufficient documentation

## 2020-12-24 ENCOUNTER — Other Ambulatory Visit: Payer: Self-pay

## 2020-12-24 ENCOUNTER — Ambulatory Visit (INDEPENDENT_AMBULATORY_CARE_PROVIDER_SITE_OTHER): Payer: BC Managed Care – PPO | Admitting: Internal Medicine

## 2020-12-24 ENCOUNTER — Encounter: Payer: Self-pay | Admitting: Internal Medicine

## 2020-12-24 ENCOUNTER — Other Ambulatory Visit (HOSPITAL_COMMUNITY)
Admission: RE | Admit: 2020-12-24 | Discharge: 2020-12-24 | Disposition: A | Discharge status: Home / Self Care | Payer: BC Managed Care – PPO | Source: Ambulatory Visit | Attending: Internal Medicine | Admitting: Internal Medicine

## 2020-12-24 VITALS — BP 130/82 | HR 86 | Temp 98.2°F | Ht 62.0 in | Wt 163.0 lb

## 2020-12-24 DIAGNOSIS — Z124 Encounter for screening for malignant neoplasm of cervix: Secondary | ICD-10-CM | POA: Insufficient documentation

## 2020-12-24 DIAGNOSIS — I10 Essential (primary) hypertension: Secondary | ICD-10-CM

## 2020-12-24 DIAGNOSIS — Z0000 Encounter for general adult medical examination without abnormal findings: Secondary | ICD-10-CM | POA: Diagnosis not present

## 2020-12-24 LAB — POCT URINALYSIS DIPSTICK
Bilirubin, UA: NEGATIVE
Blood, UA: NEGATIVE
Glucose, UA: NEGATIVE
Ketones, UA: NEGATIVE
Leukocytes, UA: NEGATIVE
Nitrite, UA: NEGATIVE
Protein, UA: NEGATIVE
Spec Grav, UA: 1.01 (ref 1.010–1.025)
Urobilinogen, UA: 0.2 E.U./dL
pH, UA: 7.5 (ref 5.0–8.0)

## 2020-12-24 NOTE — Progress Notes (Signed)
Date:  12/24/2020   Name:  Sarah Orr   DOB:  [birth year]   MRN:  [id number]   Chief Complaint: Annual Exam (Breast exam with pap)  Sarah Orr is a 60 y.o. female who presents today for her Complete Annual Exam. She feels well. She reports exercising walking X4 days a week and yard work. She reports she is sleeping well. Breast complaints none.  Mammogram: 10/2020 DEXA: none Pap smear: 08/2015 due Colonoscopy: 08/2012  Immunization History  Administered Date(s) Administered  . Influenza,inj,Quad PF,6+ Mos 06/10/2017, 06/15/2018, 06/19/2020  . Influenza-Unspecified 06/06/2019  . Moderna Sars-Covid-2 Vaccination 11/19/2019, 12/17/2019, 08/01/2020  . Zoster Recombinat (Shingrix) 12/20/2018, 03/22/2019    Hypertension This is a chronic problem. The problem is controlled. Pertinent negatives include no chest pain, headaches, palpitations or shortness of breath. Past treatments include beta blockers, ACE inhibitors and diuretics. The current treatment provides significant improvement.    Lab Results  Component Value Date   CREATININE 0.75 12/21/2019   BUN 15 12/21/2019   NA 138 12/21/2019   K 4.2 12/21/2019   CL 99 12/21/2019   CO2 26 12/21/2019   Lab Results  Component Value Date   CHOL 178 12/21/2019   HDL 72 12/21/2019   LDLCALC 93 12/21/2019   TRIG 72 12/21/2019   CHOLHDL 2.5 12/21/2019   Lab Results  Component Value Date   TSH 3.610 12/21/2019   No results found for: HGBA1C Lab Results  Component Value Date   WBC 4.7 12/21/2019   HGB 15.2 12/21/2019   HCT 43.8 12/21/2019   MCV 92 12/21/2019   PLT 243 12/21/2019   Lab Results  Component Value Date   ALT 19 12/21/2019   AST 21 12/21/2019   ALKPHOS 58 12/21/2019   BILITOT 0.4 12/21/2019     Review of Systems  Constitutional: Negative for chills, fatigue and fever.  HENT: Negative for congestion, hearing loss, tinnitus, trouble swallowing and voice change.   Eyes: Negative for visual  disturbance.  Respiratory: Negative for cough, chest tightness, shortness of breath and wheezing.   Cardiovascular: Negative for chest pain, palpitations and leg swelling.  Gastrointestinal: Negative for abdominal pain, constipation, diarrhea and vomiting.  Endocrine: Negative for polydipsia and polyuria.  Genitourinary: Negative for dysuria, frequency, genital sores, vaginal bleeding and vaginal discharge.       Possible vaginal bulge noted when washing - seems to be getting less  Musculoskeletal: Negative for arthralgias, gait problem and joint swelling.  Skin: Negative for color change and rash.  Neurological: Negative for dizziness, tremors, light-headedness and headaches.  Hematological: Negative for adenopathy. Does not bruise/bleed easily.  Psychiatric/Behavioral: Negative for dysphoric mood and sleep disturbance. The patient is not nervous/anxious.     Patient Active Problem List   Diagnosis Date Noted  . 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 Vertigo 06/15/2018  . Muscle spasms of neck 06/15/2018  . DDD (degenerative disc disease), lumbar 03/13/2015  . Essential (primary) hypertension 03/13/2015    Allergies  Allergen Reactions  . Penicillins Hives    Past Surgical History:  Procedure Laterality Date  . benign rectal mass  2002  . COLONOSCOPY  2014    Social History   Tobacco Use  . Smoking status: Never Smoker  . Smokeless tobacco: Never Used  Substance Use Topics  . Alcohol use: No    Alcohol/week: 0.0 standard drinks  . Drug use: No     Medication list has been reviewed and updated.  Current Meds  Medication  Sig  . lisinopril-hydrochlorothiazide (ZESTORETIC) 20-25 MG tablet TAKE 1 TABLET BY MOUTH EVERY DAY  . meloxicam (MOBIC) 15 MG tablet TAKE 1 TABLET (15 MG TOTAL) BY MOUTH DAILY AS NEEDED.  . metoprolol succinate (TOPROL-XL) 50 MG 24 hr tablet TAKE 1 TABLET BY MOUTH EVERY DAY    PHQ 2/9 Scores 12/24/2020 12/21/2019 12/20/2018 06/15/2018  PHQ - 2  Score 0 0 0 0  PHQ- 9 Score 0 0 - -    GAD 7 : Generalized Anxiety Score 12/24/2020 12/21/2019  Nervous, Anxious, on Edge 0 0  Control/stop worrying 0 0  Worry too much - different things 0 0  Trouble relaxing 0 0  Restless 0 0  Easily annoyed or irritable 0 0  Afraid - awful might happen 0 0  Total GAD 7 Score 0 0  Anxiety Difficulty - Not difficult at all    BP Readings from Last 3 Encounters:  12/24/20 130/82  12/21/19 118/70  12/20/18 122/78    Physical Exam Vitals and nursing note reviewed.  Constitutional:      General: She is not in acute distress.    Appearance: She is well-developed.  HENT:     Head: Normocephalic and atraumatic.     Right Ear: Tympanic membrane and ear canal normal.     Left Ear: Tympanic membrane and ear canal normal.     Nose:     Right Sinus: No maxillary sinus tenderness.     Left Sinus: No maxillary sinus tenderness.  Eyes:     General: No scleral icterus.       Right eye: No discharge.        Left eye: No discharge.     Conjunctiva/sclera: Conjunctivae normal.  Neck:     Thyroid: No thyromegaly.     Vascular: No carotid bruit.  Cardiovascular:     Rate and Rhythm: Normal rate and regular rhythm.     Pulses: Normal pulses.     Heart sounds: Normal heart sounds.  Pulmonary:     Effort: Pulmonary effort is normal. No respiratory distress.     Breath sounds: No wheezing.  Chest:  Breasts:     Right: No mass, nipple discharge, skin change or tenderness.     Left: No mass, nipple discharge, skin change or tenderness.    Abdominal:     General: Bowel sounds are normal.     Palpations: Abdomen is soft.     Tenderness: There is no abdominal tenderness.  Genitourinary:    Labia:        Right: No tenderness, lesion or injury.        Left: No tenderness, lesion or injury.      Vagina: Normal. No erythema, tenderness or prolapsed vaginal walls.     Cervix: Normal.     Adnexa: Right adnexa normal and left adnexa normal.     Comments:  Pap obtained Musculoskeletal:     Cervical back: Normal range of motion. No erythema.     Right lower leg: No edema.     Left lower leg: No edema.  Lymphadenopathy:     Cervical: No cervical adenopathy.  Skin:    General: Skin is warm and dry.     Findings: No rash.  Neurological:     Mental Status: She is alert and oriented to person, place, and time.     Cranial Nerves: No cranial nerve deficit.     Sensory: No sensory deficit.     Deep Tendon Reflexes: Reflexes  are normal and symmetric.  Psychiatric:        Attention and Perception: Attention normal.        Mood and Affect: Mood normal.     Wt Readings from Last 3 Encounters:  12/24/20 163 lb (73.9 kg)  12/21/19 164 lb (74.4 kg)  12/20/18 166 lb (75.3 kg)    BP 130/82   Pulse 86   Temp 98.2 F (36.8 C) (Oral)   Ht 5\' 2"  (1.575 m)   Wt 163 lb (73.9 kg)   SpO2 99%   BMI 29.81 kg/m   Assessment and Plan: 1. Annual physical exam Normal exam Continue healthy diet and exericse - Lipid panel  2. Encounter for screening for cervical cancer Obtained today May have a mild prolapse by sx but not apparent on exam today - Cytology - PAP  3. Essential (primary) hypertension Clinically stable exam with well controlled BP. Tolerating medications without side effects at this time. Pt to continue current regimen and low sodium diet; benefits of regular exercise as able discussed. - CBC with Differential/Platelet - Comprehensive metabolic panel - TSH - POCT urinalysis dipstick   Partially dictated using Dragon software. Any errors are unintentional.  Halina Maidens, MD [facility]  12/24/2020

## 2020-12-25 LAB — COMPREHENSIVE METABOLIC PANEL
ALT: 21 IU/L (ref 0–32)
AST: 26 IU/L (ref 0–40)
Albumin/Globulin Ratio: 2.1 (ref 1.2–2.2)
Albumin: 4.6 g/dL (ref 3.8–4.9)
Alkaline Phosphatase: 53 IU/L (ref 44–121)
BUN/Creatinine Ratio: 20 (ref 12–28)
BUN: 17 mg/dL (ref 8–27)
Bilirubin Total: 0.5 mg/dL (ref 0.0–1.2)
CO2: 25 mmol/L (ref 20–29)
Calcium: 9.6 mg/dL (ref 8.7–10.3)
Chloride: 99 mmol/L (ref 96–106)
Creatinine, Ser: 0.83 mg/dL (ref 0.57–1.00)
Globulin, Total: 2.2 g/dL (ref 1.5–4.5)
Glucose: 95 mg/dL (ref 65–99)
Potassium: 4 mmol/L (ref 3.5–5.2)
Sodium: 138 mmol/L (ref 134–144)
Total Protein: 6.8 g/dL (ref 6.0–8.5)
eGFR: 81 mL/min/{1.73_m2} (ref >59)

## 2020-12-25 LAB — CBC WITH DIFFERENTIAL/PLATELET
Basophils Absolute: 0.1 10*3/uL (ref 0.0–0.2)
Basos: 1 %
EOS (ABSOLUTE): 0.1 10*3/uL (ref 0.0–0.4)
Eos: 1 %
Hematocrit: 43.6 % (ref 34.0–46.6)
Hemoglobin: 15 g/dL (ref 11.1–15.9)
Immature Grans (Abs): 0 10*3/uL (ref 0.0–0.1)
Immature Granulocytes: 0 %
Lymphocytes Absolute: 1.5 10*3/uL (ref 0.7–3.1)
Lymphs: 27 %
MCH: 32.1 pg (ref 26.6–33.0)
MCHC: 34.4 g/dL (ref 31.5–35.7)
MCV: 93 fL (ref 79–97)
Monocytes Absolute: 0.6 10*3/uL (ref 0.1–0.9)
Monocytes: 10 %
Neutrophils Absolute: 3.4 10*3/uL (ref 1.4–7.0)
Neutrophils: 61 %
Platelets: 228 10*3/uL (ref 150–450)
RBC: 4.68 x10E6/uL (ref 3.77–5.28)
RDW: 12.1 % (ref 11.7–15.4)
WBC: 5.6 10*3/uL (ref 3.4–10.8)

## 2020-12-25 LAB — LIPID PANEL
Chol/HDL Ratio: 2.6 ratio (ref 0.0–4.4)
Cholesterol, Total: 180 mg/dL (ref 100–199)
HDL: 69 mg/dL (ref >39)
LDL Chol Calc (NIH): 94 mg/dL (ref 0–99)
Triglycerides: 92 mg/dL (ref 0–149)
VLDL Cholesterol Cal: 17 mg/dL (ref 5–40)

## 2020-12-25 LAB — TSH: TSH: 2.74 u[IU]/mL (ref 0.450–4.500)

## 2020-12-25 LAB — CYTOLOGY - PAP
Comment: NEGATIVE
Diagnosis: NEGATIVE
High risk HPV: NEGATIVE

## 2020-12-31 ENCOUNTER — Other Ambulatory Visit: Payer: Self-pay | Admitting: Internal Medicine

## 2020-12-31 NOTE — Telephone Encounter (Signed)
Requested Prescriptions  Pending Prescriptions Disp Refills  . metoprolol succinate (TOPROL-XL) 50 MG 24 hr tablet [Pharmacy Med Name: METOPROLOL SUCC ER 50 MG TAB] 90 tablet 1    Sig: TAKE 1 TABLET BY MOUTH EVERY DAY     Cardiovascular:  Beta Blockers Passed - 12/31/2020  1:34 AM      Passed - Last BP in normal range    BP Readings from Last 1 Encounters:  12/24/20 130/82         Passed - Last Heart Rate in normal range    Pulse Readings from Last 1 Encounters:  12/24/20 86         Passed - Valid encounter within last 6 months    Recent Outpatient Visits          1 week ago Annual physical exam   [facility] Glean [facility], [facility]   1 year ago Annual physical exam   [facility] Glean Hess, MD   2 years ago Annual physical exam   [facility] Glean Hess, MD   2 years ago Essential (primary) hypertension   [facility] Glean Hess, MD   3 years ago Annual physical exam   [facility] Glean Hess, MD      Future Appointments            In 5 months Army Melia Jesse Sans, MD [facility], [place]   In 12 months Army Melia, Jesse Sans, MD [facility], [facility]

## 2021-01-14 ENCOUNTER — Other Ambulatory Visit: Payer: Self-pay | Admitting: Internal Medicine

## 2021-01-31 ENCOUNTER — Telehealth: Payer: Self-pay

## 2021-01-31 NOTE — Telephone Encounter (Signed)
Called and left VM informing patient to schedule mammogram as soon as possible.  Gave her number to schedule mammo: [phone]

## 2021-04-09 ENCOUNTER — Other Ambulatory Visit: Payer: Self-pay | Admitting: Internal Medicine

## 2021-04-09 NOTE — Telephone Encounter (Signed)
Requested Prescriptions  Pending Prescriptions Disp Refills  . lisinopril-hydrochlorothiazide (ZESTORETIC) 20-25 MG tablet [Pharmacy Med Name: LISINOPRIL-HCTZ 20-25 MG TAB] 90 tablet 0    Sig: TAKE 1 TABLET BY MOUTH EVERY DAY     Cardiovascular:  ACEI + Diuretic Combos Passed - 04/09/2021  2:37 AM      Passed - Na in normal range and within 180 days    Sodium  Date Value Ref Range Status  12/24/2020 138 134 - 144 mmol/L Final         Passed - K in normal range and within 180 days    Potassium  Date Value Ref Range Status  12/24/2020 4.0 3.5 - 5.2 mmol/L Final         Passed - Cr in normal range and within 180 days    Creatinine, Ser  Date Value Ref Range Status  12/24/2020 0.83 0.57 - 1.00 mg/dL Final         Passed - Ca in normal range and within 180 days    Calcium  Date Value Ref Range Status  12/24/2020 9.6 8.7 - 10.3 mg/dL Final         Passed - Patient is not pregnant      Passed - Last BP in normal range    BP Readings from Last 1 Encounters:  12/24/20 130/82         Passed - Valid encounter within last 6 months    Recent Outpatient Visits          3 months ago Annual physical exam   [facility] Glean Hess, MD   1 year ago Annual physical exam   [facility] Glean Hess, MD   2 years ago Annual physical exam   [facility] Glean Hess, MD   2 years ago Essential (primary) hypertension   [facility] Glean Hess, MD   3 years ago Annual physical exam   [facility] Glean Hess, MD      Future Appointments            In 2 months Army Melia Jesse Sans, MD [facility], [place]   In 8 months Army Melia, Jesse Sans, MD [facility], [facility]

## 2021-06-09 ENCOUNTER — Other Ambulatory Visit: Payer: Self-pay

## 2021-06-09 ENCOUNTER — Encounter: Payer: Self-pay | Admitting: Internal Medicine

## 2021-06-09 ENCOUNTER — Ambulatory Visit (INDEPENDENT_AMBULATORY_CARE_PROVIDER_SITE_OTHER): Payer: BC Managed Care – PPO | Admitting: Internal Medicine

## 2021-06-09 VITALS — BP 128/78 | HR 78 | Temp 98.3°F | Ht 62.0 in | Wt 149.0 lb

## 2021-06-09 DIAGNOSIS — I10 Essential (primary) hypertension: Secondary | ICD-10-CM | POA: Diagnosis not present

## 2021-06-09 DIAGNOSIS — Z23 Encounter for immunization: Secondary | ICD-10-CM

## 2021-06-09 MED ORDER — METOPROLOL SUCCINATE ER 50 MG PO TB24: 50.0000 mg | ORAL_TABLET | Freq: Every day | ORAL | 3 refills | Status: DC

## 2021-06-09 MED ORDER — LISINOPRIL-HYDROCHLOROTHIAZIDE 20-25 MG PO TABS: 1.0000 | ORAL_TABLET | Freq: Every day | ORAL | 3 refills | Status: DC

## 2021-06-09 NOTE — Progress Notes (Signed)
Date:  06/09/2021   Name:  Sarah Orr   DOB:  03-18-1961   MRN:  [id number]   Chief Complaint: Hypertension and Flu Vaccine  Hypertension This is a chronic problem. The problem is controlled. Pertinent negatives include no chest pain, headaches, palpitations or shortness of breath. Past treatments include ACE inhibitors, beta blockers and diuretics.  She has been walking and cutting back on intake and has lost about 15 lbs.  Lab Results  Component Value Date   CREATININE 0.83 12/24/2020   BUN 17 12/24/2020   NA 138 12/24/2020   K 4.0 12/24/2020   CL 99 12/24/2020   CO2 25 12/24/2020   Lab Results  Component Value Date   CHOL 180 12/24/2020   HDL 69 12/24/2020   LDLCALC 94 12/24/2020   TRIG 92 12/24/2020   CHOLHDL 2.6 12/24/2020   Lab Results  Component Value Date   TSH 2.740 12/24/2020   No results found for: HGBA1C Lab Results  Component Value Date   WBC 5.6 12/24/2020   HGB 15.0 12/24/2020   HCT 43.6 12/24/2020   MCV 93 12/24/2020   PLT 228 12/24/2020   Lab Results  Component Value Date   ALT 21 12/24/2020   AST 26 12/24/2020   ALKPHOS 53 12/24/2020   BILITOT 0.5 12/24/2020     Review of Systems  Constitutional:  Negative for fatigue and unexpected weight change.  HENT:  Negative for nosebleeds.   Eyes:  Negative for visual disturbance.  Respiratory:  Negative for cough, chest tightness, shortness of breath and wheezing.   Cardiovascular:  Negative for chest pain, palpitations and leg swelling.  Gastrointestinal:  Negative for abdominal pain, constipation and diarrhea.  Musculoskeletal:  Positive for arthralgias (right hip), back pain and gait problem.  Neurological:  Negative for dizziness, weakness, light-headedness and headaches.  Psychiatric/Behavioral:  Negative for dysphoric mood and sleep disturbance. The patient is not nervous/anxious.    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  Current Meds  Medication Sig   lisinopril-hydrochlorothiazide (ZESTORETIC) 20-25 MG tablet TAKE 1 TABLET BY MOUTH EVERY DAY   meloxicam (MOBIC) 15 MG tablet TAKE 1 TABLET BY MOUTH DAILY AS NEEDED.   metoprolol succinate (TOPROL-XL) 50 MG 24 hr tablet TAKE 1 TABLET BY MOUTH EVERY DAY    PHQ 2/9 Scores 06/09/2021 12/24/2020 12/21/2019 12/20/2018  PHQ - 2 Score 0 0 0 0  PHQ- 9 Score 2 0 0 -    GAD 7 : Generalized Anxiety Score 06/09/2021 12/24/2020 12/21/2019  Nervous, Anxious, on Edge 1 0 0  Control/stop worrying 0 0 0  Worry too much - different things 0 0 0  Trouble relaxing 0 0 0  Restless 0 0 0  Easily annoyed or irritable 0 0 0  Afraid - awful might happen 0  0 0  Total GAD 7 Score 1 0 0  Anxiety Difficulty Not difficult at all - Not difficult at all    BP Readings from Last 3 Encounters:  06/09/21 128/78  12/24/20 130/82  12/21/19 118/70    Physical Exam Vitals and nursing note reviewed.  Constitutional:      General: She is not in acute distress.    Appearance: She is well-developed.  HENT:     Head: Normocephalic and atraumatic.  Cardiovascular:     Rate and Rhythm: Normal rate and regular rhythm.     Pulses: Normal pulses.  Pulmonary:     Effort: Pulmonary effort is normal. No respiratory distress.     Breath sounds: No wheezing or rhonchi.  Musculoskeletal:     Cervical back: Normal range of motion.     Right lower  leg: No edema.     Left lower leg: No edema.  Lymphadenopathy:     Cervical: No cervical adenopathy.  Skin:    General: Skin is warm and dry.     Capillary Refill: Capillary refill takes less than 2 seconds.     Findings: No rash.  Neurological:     General: No focal deficit present.     Mental Status: She is alert and oriented to person, place, and time.  Psychiatric:        Mood and Affect: Mood normal.        Behavior: Behavior normal.    Wt Readings from Last 3 Encounters:  06/09/21 149 lb (67.6 kg)  12/24/20 163 lb (73.9 kg)  12/21/19 164 lb (74.4 kg)    BP 128/78   Pulse 78   Temp 98.3 F (36.8 C) (Oral)   Ht 5\' 2"  (1.575 m)   Wt 149 lb (67.6 kg)   SpO2 99%   BMI 27.25 kg/m   Assessment and Plan: 1. Essential (primary) hypertension Clinically stable exam with well controlled BP. Tolerating medications without side effects at this time. Pt to continue current regimen and low sodium diet; benefits of regular exercise as able discussed. - lisinopril-hydrochlorothiazide (ZESTORETIC) 20-25 MG tablet; Take 1 tablet by mouth daily.  Dispense: 90 tablet; Refill: 3 - metoprolol succinate (TOPROL-XL) 50 MG 24 hr tablet; Take 1 tablet (50 mg total) by mouth daily. Take with or immediately following a meal.  Dispense: 90 tablet; Refill: 3   Partially dictated using Editor, commissioning. Any errors are unintentional.  Halina Maidens, MD [facility]  06/09/2021

## 2021-12-26 ENCOUNTER — Ambulatory Visit (INDEPENDENT_AMBULATORY_CARE_PROVIDER_SITE_OTHER): Payer: BC Managed Care – PPO | Admitting: Internal Medicine

## 2021-12-26 ENCOUNTER — Encounter: Payer: Self-pay | Admitting: Internal Medicine

## 2021-12-26 VITALS — BP 104/76 | HR 84 | Ht 62.0 in | Wt 137.0 lb

## 2021-12-26 DIAGNOSIS — Z1231 Encounter for screening mammogram for malignant neoplasm of breast: Secondary | ICD-10-CM

## 2021-12-26 DIAGNOSIS — Z0000 Encounter for general adult medical examination without abnormal findings: Secondary | ICD-10-CM

## 2021-12-26 DIAGNOSIS — I10 Essential (primary) hypertension: Secondary | ICD-10-CM

## 2021-12-26 LAB — POCT URINALYSIS DIPSTICK
Bilirubin, UA: NEGATIVE
Blood, UA: NEGATIVE
Glucose, UA: NEGATIVE
Ketones, UA: NEGATIVE
Leukocytes, UA: NEGATIVE
Nitrite, UA: NEGATIVE
Protein, UA: NEGATIVE
Spec Grav, UA: 1.01 (ref 1.010–1.025)
Urobilinogen, UA: 0.2 E.U./dL
pH, UA: 7 (ref 5.0–8.0)

## 2021-12-26 NOTE — Progress Notes (Signed)
? ? ?Date:  12/26/2021  ? ?Name:  Sarah Orr   DOB:  11/26/1960   MRN:  [id number] ? ? ?Chief Complaint: Annual Exam (Breast exam no pap ) ?Sarah Orr is a 61 y.o. female who presents today for her Complete Annual Exam. She feels well. She reports exercising walking 5 days a week. She reports she is sleeping well. Breast complaints none. She continues to lose weight with diet and exercise - has lost 25 lbs since last year. ? ?Mammogram: 10/2020 ?DEXA: none ?Pap smear: 11/2020 neg with co-testing ?Colonoscopy: 07/2013 repeat 10 yrs ? ?Health Maintenance Due  ?Topic Date Due  ? TETANUS/TDAP  Never done  ? MAMMOGRAM  11/28/2021  ?  ?Immunization History  ?Administered Date(s) Administered  ? Influenza,inj,Quad PF,6+ Mos 06/10/2017, 06/15/2018, 06/19/2020, 06/09/2021  ? Influenza-Unspecified 06/06/2019  ? Moderna Sars-Covid-2 Vaccination 11/19/2019, 12/17/2019, 08/01/2020  ? Pension scheme manager 20yrs & up 05/20/2021  ? Zoster Recombinat (Shingrix) 12/20/2018, 03/22/2019  ? ? ?Hypertension ?This is a chronic problem. The problem is controlled. Pertinent negatives include no chest pain, headaches, palpitations or shortness of breath. Past treatments include beta blockers, diuretics and ACE inhibitors. There is no history of kidney disease, CAD/MI or CVA.  ? ?Lab Results  ?Component Value Date  ? NA 138 12/24/2020  ? K 4.0 12/24/2020  ? CO2 25 12/24/2020  ? GLUCOSE 95 12/24/2020  ? BUN 17 12/24/2020  ? CREATININE 0.83 12/24/2020  ? CALCIUM 9.6 12/24/2020  ? EGFR 81 12/24/2020  ? GFRNONAA 88 12/21/2019  ? ?Lab Results  ?Component Value Date  ? CHOL 180 12/24/2020  ? HDL 69 12/24/2020  ? [place] 94 12/24/2020  ? TRIG 92 12/24/2020  ? CHOLHDL 2.6 12/24/2020  ? ?Lab Results  ?Component Value Date  ? TSH 2.740 12/24/2020  ? ?No results found for: HGBA1C ?Lab Results  ?Component Value Date  ? WBC 5.6 12/24/2020  ? HGB 15.0 12/24/2020  ? HCT 43.6 12/24/2020  ? MCV 93 12/24/2020  ? PLT 228  12/24/2020  ? ?Lab Results  ?Component Value Date  ? ALT 21 12/24/2020  ? AST 26 12/24/2020  ? ALKPHOS 53 12/24/2020  ? BILITOT 0.5 12/24/2020  ? ?No results found for: 25OHVITD2, [place], VD25OH  ? ?Review of Systems  ?Constitutional:  Negative for chills, fatigue and fever.  ?HENT:  Positive for tinnitus. Negative for congestion, hearing loss, trouble swallowing and voice change.   ?Eyes:  Negative for visual disturbance.  ?Respiratory:  Negative for cough, chest tightness, shortness of breath and wheezing.   ?Cardiovascular:  Negative for chest pain, palpitations and leg swelling.  ?Gastrointestinal:  Negative for abdominal pain, constipation, diarrhea and vomiting.  ?Endocrine: Negative for polydipsia and polyuria.  ?Genitourinary:  Negative for dysuria, frequency, genital sores, vaginal bleeding and vaginal discharge.  ?Musculoskeletal:  Negative for arthralgias, gait problem and joint swelling.  ?Skin:  Negative for color change and rash.  ?Neurological:  Negative for dizziness, tremors, light-headedness and headaches.  ?Hematological:  Negative for adenopathy. Does not bruise/bleed easily.  ?Psychiatric/Behavioral:  Negative for dysphoric mood and sleep disturbance. The patient is not nervous/anxious.   ? ?Patient Active Problem List  ? Diagnosis Date Noted  ? 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 Vertigo 06/15/2018  ? Muscle spasms of neck 06/15/2018  ? DDD (degenerative disc disease), lumbar 03/13/2015  ? Essential (primary) hypertension 03/13/2015  ? ? ?Allergies  ?Allergen Reactions  ? Penicillins Hives  ? ? ?Past Surgical History:  ?Procedure  Laterality Date  ? benign rectal mass  2002  ? COLONOSCOPY  2014  ? ? ?Social History  ? ?Tobacco Use  ? Smoking status: Never  ? Smokeless tobacco: Never  ?Substance Use Topics  ? Alcohol use: No  ?  Alcohol/week: 0.0 standard drinks  ? Drug use: No  ? ? ? ?Medication list has been reviewed and updated. ? ?Current Meds  ?Medication Sig  ?  lisinopril-hydrochlorothiazide (ZESTORETIC) 20-25 MG tablet Take 1 tablet by mouth daily.  ? meloxicam (MOBIC) 15 MG tablet TAKE 1 TABLET BY MOUTH DAILY AS NEEDED.  ? metoprolol succinate (TOPROL-XL) 50 MG 24 hr tablet Take 1 tablet (50 mg total) by mouth daily. Take with or immediately following a meal.  ? ? ? ?  12/26/2021  ?  9:04 AM 06/09/2021  ?  9:12 AM 12/24/2020  ?  9:05 AM 12/21/2019  ? 10:29 AM  ?GAD 7 : Generalized Anxiety Score  ?Nervous, Anxious, on Edge 0 1 0 0  ?Control/stop worrying 0 0 0 0  ?Worry too much - different things 0 0 0 0  ?Trouble relaxing 0 0 0 0  ?Restless 0 0 0 0  ?Easily annoyed or irritable 0 0 0 0  ?Afraid - awful might happen 0 0 0 0  ?Total GAD 7 Score 0 1 0 0  ?Anxiety Difficulty  Not difficult at all  Not difficult at all  ? ? ? ?  12/26/2021  ?  9:03 AM  ?Depression screen PHQ 2/9  ?Decreased Interest 0  ?Down, Depressed, Hopeless 0  ?PHQ - 2 Score 0  ?Altered sleeping 1  ?Tired, decreased energy 1  ?Change in appetite 0  ?Feeling bad or failure about yourself  0  ?Trouble concentrating 0  ?Moving slowly or fidgety/restless 0  ?Suicidal thoughts 0  ?PHQ-9 Score 2  ?Difficult doing work/chores Not difficult at all  ? ? ?BP Readings from Last 3 Encounters:  ?12/26/21 104/76  ?06/09/21 128/78  ?12/24/20 130/82  ? ? ?Physical Exam ?Vitals and nursing note reviewed.  ?Constitutional:   ?   General: She is not in acute distress. ?   Appearance: She is well-developed.  ?HENT:  ?   Head: Normocephalic and atraumatic.  ?   Right Ear: Tympanic membrane and ear canal normal.  ?   Left Ear: Tympanic membrane and ear canal normal.  ?   Nose:  ?   Right Sinus: No maxillary sinus tenderness.  ?   Left Sinus: No maxillary sinus tenderness.  ?Eyes:  ?   General: No scleral icterus.    ?   Right eye: No discharge.     ?   Left eye: No discharge.  ?   Conjunctiva/sclera: Conjunctivae normal.  ?Neck:  ?   Thyroid: No thyromegaly.  ?   Vascular: No carotid bruit.  ?Cardiovascular:  ?   Rate and  Rhythm: Normal rate and regular rhythm.  ?   Pulses: Normal pulses.  ?   Heart sounds: Normal heart sounds.  ?Pulmonary:  ?   Effort: Pulmonary effort is normal. No respiratory distress.  ?   Breath sounds: No wheezing.  ?Chest:  ?Breasts: ?   Right: No mass, nipple discharge, skin change or tenderness.  ?   Left: No mass, nipple discharge, skin change or tenderness.  ?Abdominal:  ?   General: Bowel sounds are normal.  ?   Palpations: Abdomen is soft.  ?   Tenderness: There is no abdominal tenderness.  ?Musculoskeletal:  ?  Cervical back: Normal range of motion. No erythema.  ?   Right lower leg: No edema.  ?   Left lower leg: No edema.  ?Lymphadenopathy:  ?   Cervical: No cervical adenopathy.  ?Skin: ?   General: Skin is warm and dry.  ?   Findings: No rash.  ?Neurological:  ?   Mental Status: She is alert and oriented to person, place, and time.  ?   Cranial Nerves: No cranial nerve deficit.  ?   Sensory: No sensory deficit.  ?   Deep Tendon Reflexes: Reflexes are normal and symmetric.  ?Psychiatric:     ?   Attention and Perception: Attention normal.     ?   Mood and Affect: Mood normal.  ? ? ?Wt Readings from Last 3 Encounters:  ?12/26/21 137 lb (62.1 kg)  ?06/09/21 149 lb (67.6 kg)  ?12/24/20 163 lb (73.9 kg)  ? ? ?BP 104/76   Pulse 84   Ht $R'5\' 2"'vv$  (1.575 m)   Wt 137 lb (62.1 kg)   SpO2 100%   BMI 25.06 kg/m?  ? ?Assessment and Plan: ?1. Annual physical exam ?Normal exam ?Continue healthy diet and exercise. She is having excellent weight loss results. ?Up to date on screenings and immunizations. ?TDap recommended with an injury ?- Lipid panel ?- Hemoglobin A1c ? ?2. Encounter for screening mammogram for breast cancer ?[facility] ?- MM 3D SCREEN BREAST BILATERAL ? ?3. Essential (primary) hypertension ?Clinically stable exam with well controlled BP. ?Tolerating medications without side effects at this time. ?Pt to continue current regimen and low sodium diet; benefits of regular exercise as able  discussed. ?- CBC with Differential/Platelet ?- Comprehensive metabolic panel ?- POCT urinalysis dipstick ?- TSH ? ? ?Partially dictated using Editor, commissioning. Any errors are unintentional. ? ?Halina Maidens, MD ?[facility] ?[place]

## 2021-12-27 LAB — CBC WITH DIFFERENTIAL/PLATELET
Basophils Absolute: 0.1 10*3/uL (ref 0.0–0.2)
Basos: 1 %
EOS (ABSOLUTE): 0.1 10*3/uL (ref 0.0–0.4)
Eos: 1 %
Hematocrit: 43.6 % (ref 34.0–46.6)
Hemoglobin: 14.8 g/dL (ref 11.1–15.9)
Immature Grans (Abs): 0 10*3/uL (ref 0.0–0.1)
Immature Granulocytes: 0 %
Lymphocytes Absolute: 1.6 10*3/uL (ref 0.7–3.1)
Lymphs: 29 %
MCH: 31.8 pg (ref 26.6–33.0)
MCHC: 33.9 g/dL (ref 31.5–35.7)
MCV: 94 fL (ref 79–97)
Monocytes Absolute: 0.4 10*3/uL (ref 0.1–0.9)
Monocytes: 7 %
Neutrophils Absolute: 3.3 10*3/uL (ref 1.4–7.0)
Neutrophils: 62 %
Platelets: 243 10*3/uL (ref 150–450)
RBC: 4.65 x10E6/uL (ref 3.77–5.28)
RDW: 12.1 % (ref 11.7–15.4)
WBC: 5.4 10*3/uL (ref 3.4–10.8)

## 2021-12-27 LAB — COMPREHENSIVE METABOLIC PANEL
ALT: 17 IU/L (ref 0–32)
AST: 21 IU/L (ref 0–40)
Albumin/Globulin Ratio: 2 (ref 1.2–2.2)
Albumin: 4.4 g/dL (ref 3.8–4.8)
Alkaline Phosphatase: 60 IU/L (ref 44–121)
BUN/Creatinine Ratio: 18 (ref 12–28)
BUN: 13 mg/dL (ref 8–27)
Bilirubin Total: 0.5 mg/dL (ref 0.0–1.2)
CO2: 25 mmol/L (ref 20–29)
Calcium: 9.8 mg/dL (ref 8.7–10.3)
Chloride: 99 mmol/L (ref 96–106)
Creatinine, Ser: 0.74 mg/dL (ref 0.57–1.00)
Globulin, Total: 2.2 g/dL (ref 1.5–4.5)
Glucose: 92 mg/dL (ref 70–99)
Potassium: 3.9 mmol/L (ref 3.5–5.2)
Sodium: 139 mmol/L (ref 134–144)
Total Protein: 6.6 g/dL (ref 6.0–8.5)
eGFR: 92 mL/min/{1.73_m2} (ref >59)

## 2021-12-27 LAB — LIPID PANEL
Chol/HDL Ratio: 2.2 ratio (ref 0.0–4.4)
Cholesterol, Total: 182 mg/dL (ref 100–199)
HDL: 81 mg/dL (ref >39)
LDL Chol Calc (NIH): 88 mg/dL (ref 0–99)
Triglycerides: 70 mg/dL (ref 0–149)
VLDL Cholesterol Cal: 13 mg/dL (ref 5–40)

## 2021-12-27 LAB — TSH: TSH: 3.19 u[IU]/mL (ref 0.450–4.500)

## 2021-12-27 LAB — HEMOGLOBIN A1C
Est. average glucose Bld gHb Est-mCnc: 108 mg/dL
Hgb A1c MFr Bld: 5.4 % (ref 4.8–5.6)

## 2022-02-17 ENCOUNTER — Ambulatory Visit
Admission: RE | Admit: 2022-02-17 | Discharge: 2022-02-17 | Disposition: A | Discharge status: Home / Self Care | Payer: BC Managed Care – PPO | Source: Ambulatory Visit | Attending: Internal Medicine | Admitting: Internal Medicine

## 2022-02-17 DIAGNOSIS — Z1231 Encounter for screening mammogram for malignant neoplasm of breast: Secondary | ICD-10-CM | POA: Diagnosis not present

## 2022-06-24 ENCOUNTER — Ambulatory Visit (INDEPENDENT_AMBULATORY_CARE_PROVIDER_SITE_OTHER): Payer: BC Managed Care – PPO | Admitting: Internal Medicine

## 2022-06-24 ENCOUNTER — Encounter: Payer: Self-pay | Admitting: Internal Medicine

## 2022-06-24 VITALS — BP 122/82 | HR 93 | Ht 62.0 in | Wt 134.0 lb

## 2022-06-24 DIAGNOSIS — I10 Essential (primary) hypertension: Secondary | ICD-10-CM

## 2022-06-24 DIAGNOSIS — H9313 Tinnitus, bilateral: Secondary | ICD-10-CM | POA: Diagnosis not present

## 2022-06-24 DIAGNOSIS — Z23 Encounter for immunization: Secondary | ICD-10-CM | POA: Diagnosis not present

## 2022-06-24 NOTE — Patient Instructions (Signed)
For BP the goal is <130/80. If more than half your readings are <110 the call for med adjustment.

## 2022-06-24 NOTE — Progress Notes (Signed)
Date:  06/24/2022   Name:  Sarah Orr   DOB:  [birth year]   MRN:  [id number]   Chief Complaint: Hypertension  Hypertension This is a chronic problem. The problem is controlled (reading at home average around 120. occasionally low). Pertinent negatives include no chest pain, headaches, palpitations or shortness of breath. Past treatments include beta blockers, diuretics and ACE inhibitors.  Ear Fullness  There is pain in the left ear. This is a new (can hear a pulse in left ear) problem. Pertinent negatives include no abdominal pain, coughing, diarrhea or headaches. Associated symptoms comments: Chronic tinnitis.    Lab Results  Component Value Date   NA 139 12/26/2021   K 3.9 12/26/2021   CO2 25 12/26/2021   GLUCOSE 92 12/26/2021   BUN 13 12/26/2021   CREATININE 0.74 12/26/2021   CALCIUM 9.8 12/26/2021   EGFR 92 12/26/2021   GFRNONAA 88 12/21/2019   Lab Results  Component Value Date   CHOL 182 12/26/2021   HDL 81 12/26/2021   LDLCALC 88 12/26/2021   TRIG 70 12/26/2021   CHOLHDL 2.2 12/26/2021   Lab Results  Component Value Date   TSH 3.190 12/26/2021   Lab Results  Component Value Date   HGBA1C 5.4 12/26/2021   Lab Results  Component Value Date   WBC 5.4 12/26/2021   HGB 14.8 12/26/2021   HCT 43.6 12/26/2021   MCV 94 12/26/2021   PLT 243 12/26/2021   Lab Results  Component Value Date   ALT 17 12/26/2021   AST 21 12/26/2021   ALKPHOS 60 12/26/2021   BILITOT 0.5 12/26/2021   No results found for: "25OHVITD2", "25OHVITD3", "VD25OH"   Review of Systems  Constitutional:  Negative for fatigue and unexpected weight change.  HENT:  Positive for tinnitus. Negative for nosebleeds.   Eyes:  Negative for visual disturbance.  Respiratory:  Negative for cough, chest tightness, shortness of breath and wheezing.   Cardiovascular:  Negative for chest pain, palpitations and leg swelling.  Gastrointestinal:  Negative for abdominal pain, constipation and  diarrhea.  Neurological:  Negative for dizziness, weakness, light-headedness and headaches.  Psychiatric/Behavioral:  Negative for dysphoric mood and sleep disturbance. The patient is not nervous/anxious.     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  Current Meds  Medication Sig   Acetaminophen (TYLENOL PO) Take by mouth as needed.   Ibuprofen (ADVIL PO) Take by mouth as needed.   lisinopril-hydrochlorothiazide (ZESTORETIC) 20-25 MG tablet Take 1 tablet by mouth daily.   meloxicam (MOBIC) 15 MG tablet TAKE 1 TABLET BY MOUTH DAILY AS NEEDED.   metoprolol succinate (TOPROL-XL) 50 MG 24 hr tablet Take 1 tablet (50 mg total) by mouth daily. Take with or immediately following a meal.       06/24/2022  8:24 AM 12/26/2021    9:04 AM 06/09/2021    9:12 AM 12/24/2020    9:05 AM  GAD 7 : Generalized Anxiety Score  Nervous, Anxious, on Edge 1 0 1 0  Control/stop worrying 0 0 0 0  Worry too much - different things 0 0 0 0  Trouble relaxing 0 0 0 0  Restless 0 0 0 0  Easily annoyed or irritable 0 0 0 0  Afraid - awful might happen 0 0 0 0  Total GAD 7 Score 1 0 1 0  Anxiety Difficulty Not difficult at all  Not difficult at all        06/24/2022    8:24 AM 12/26/2021    9:03 AM 06/09/2021    9:11 AM  Depression screen PHQ 2/9  Decreased Interest 0 0 0  Down, Depressed,  Hopeless 0 0 0  PHQ - 2 Score 0 0 0  Altered sleeping $RemoveBeforeDE'1 1 1  'NzbzBnxafBGbxrV$ Tired, decreased energy $RemoveBeforeDE'1 1 1  'avftLFkVcVeEMMG$ Change in appetite 0 0 0  Feeling bad or failure about yourself  0 0 0  Trouble concentrating 0 0 0  Moving slowly or fidgety/restless 0 0 0  Suicidal thoughts 0 0 0  PHQ-9 Score $RemoveBef'2 2 2  'IPZHnASCKI$ Difficult doing work/chores Not difficult at all Not difficult at all Not difficult at all    BP Readings from Last 3 Encounters:  06/24/22 122/82  12/26/21 104/76  06/09/21 128/78    Physical Exam Vitals and nursing note reviewed.  Constitutional:      General: She is not in acute distress.    Appearance: She is well-developed.  HENT:     Head: Normocephalic and atraumatic.     Right Ear: Hearing normal. Tympanic membrane is not erythematous or retracted.     Left Ear: Hearing normal. Tympanic membrane is retracted. Tympanic membrane is not erythematous.  Cardiovascular:     Rate and Rhythm: Normal rate and regular rhythm.     Pulses: Normal pulses.  Pulmonary:     Effort: Pulmonary effort is normal. No respiratory distress.     Breath sounds: No wheezing or rhonchi.  Musculoskeletal:     Cervical back: Normal range of motion.     Right lower leg: No edema.     Left lower leg: No edema.  Lymphadenopathy:     Cervical: No cervical adenopathy.  Skin:    General: Skin is warm and dry.     Findings: No rash.  Neurological:     General: No focal deficit present.     Mental Status: She is alert and oriented to person, place, and time.  Psychiatric:        Mood and Affect: Mood normal.        Behavior: Behavior normal.     Wt Readings from Last 3 Encounters:  06/24/22 134 lb (60.8 kg)  12/26/21 137 lb (62.1 kg)  06/09/21 149 lb (67.6 kg)    BP 122/82   Pulse 93   Ht $R'5\' 2"'Zu$  (1.575 m)   Wt 134 lb (60.8 kg)   SpO2 94%   BMI 24.51 kg/m   Assessment and Plan: 1. Essential (primary) hypertension Clinically stable exam with well controlled BP. Tolerating medications without side effects at  this time. Pt to continue current regimen and low sodium diet; benefits of regular exercise as able discussed. Monitor at home and call for dose reduction if >half the readings are less than 846 systolic.  2. Tinnitus aurium, bilateral  With left sided congestion/eustachian tube dysfunction Recommend Flonase NS daily.    Partially dictated using Editor, commissioning. Any errors are unintentional.  Halina Maidens, MD [facility]  06/24/2022

## 2022-06-25 ENCOUNTER — Other Ambulatory Visit: Payer: Self-pay | Admitting: Internal Medicine

## 2022-06-25 DIAGNOSIS — I10 Essential (primary) hypertension: Secondary | ICD-10-CM

## 2022-06-25 NOTE — Telephone Encounter (Signed)
Requested medication (s) are due for refill today: yes  Requested medication (s) are on the active medication list: yes  Last refill:  06/09/21 #90 3 RF  Future visit scheduled: yes  Notes to clinic:  overdue lab work   Requested Prescriptions  Pending Prescriptions Disp Refills   lisinopril-hydrochlorothiazide (ZESTORETIC) 20-25 MG tablet [Pharmacy Med Name: LISINOPRIL-HCTZ 20-25 MG TAB] 90 tablet     Sig: TAKE 1 TABLET BY MOUTH EVERY DAY     Cardiovascular:  ACEI + Diuretic Combos Failed - 06/25/2022  1:33 AM      Failed - Na in normal range and within 180 days    Sodium  Date Value Ref Range Status  12/26/2021 139 134 - 144 mmol/L Final         Failed - K in normal range and within 180 days    Potassium  Date Value Ref Range Status  12/26/2021 3.9 3.5 - 5.2 mmol/L Final         Failed - Cr in normal range and within 180 days    Creatinine, Ser  Date Value Ref Range Status  12/26/2021 0.74 0.57 - 1.00 mg/dL Final         Failed - eGFR is 30 or above and within 180 days    GFR calc Af Amer  Date Value Ref Range Status  12/21/2019 101 >59 mL/min/1.73 Final   GFR calc non Af Amer  Date Value Ref Range Status  12/21/2019 88 >59 mL/min/1.73 Final   eGFR  Date Value Ref Range Status  12/26/2021 92 >59 mL/min/1.73 Final         Passed - Patient is not pregnant      Passed - Last BP in normal range    BP Readings from Last 1 Encounters:  06/24/22 122/82         Passed - Valid encounter within last 6 months    Recent Outpatient Visits           Yesterday Essential (primary) hypertension   [place] [facility] Care and [facility] Medicine at [facility], Jesse Sans, MD   6 months ago Annual physical exam   [place] [facility] Care and Sports Medicine at [facility], Jesse Sans, MD   1 year ago Essential (primary) hypertension   [place] Primary Care and Sports Medicine at [facility], Jesse Sans, MD   1 year ago Annual physical  exam   [place] Primary Care and Sports Medicine at [facility], Jesse Sans, MD   2 years ago Annual physical exam    Primary Care and Sports Medicine at [facility], Jesse Sans, MD       Future Appointments             In 6 months Army Melia Jesse Sans, MD [facility] Primary Care and Sports Medicine at [facility], [facility]            Signed Prescriptions Disp Refills   metoprolol succinate (TOPROL-XL) 50 MG 24 hr tablet 90 tablet 1    Sig: TAKE 1 TABLET BY MOUTH DAILY. TAKE WITH OR IMMEDIATELY FOLLOWING A MEAL.     Cardiovascular:  Beta Blockers Passed - 06/25/2022  1:33 AM      Passed - Last BP in normal range    BP Readings from Last 1 Encounters:  06/24/22 122/82         Passed - Last Heart Rate in normal range    Pulse  Readings from Last 1 Encounters:  06/24/22 93         Passed - Valid encounter within last 6 months    Recent Outpatient Visits           Yesterday Essential (primary) hypertension   [place] Primary Care and Sports Medicine at [facility], Jesse Sans, MD   6 months ago Annual physical exam   [place] Primary Care and Sports Medicine at [facility], Jesse Sans, MD   1 year ago Essential (primary) hypertension   [place] [facility] Care and Sports Medicine at [facility], Jesse Sans, MD   1 year ago Annual physical exam   [facility] Primary Care and Sports Medicine at [facility], Jesse Sans, MD   2 years ago Annual physical exam   [facility] Primary Care and Sports Medicine at [facility], Jesse Sans, MD       Future Appointments             In 6 months Army Melia, Jesse Sans, MD [place] Primary Care and Sports Medicine at [facility]

## 2022-06-25 NOTE — Telephone Encounter (Signed)
Requested Prescriptions  Pending Prescriptions Disp Refills  . lisinopril-hydrochlorothiazide (ZESTORETIC) 20-25 MG tablet [Pharmacy Med Name: LISINOPRIL-HCTZ 20-25 MG TAB] 90 tablet     Sig: TAKE 1 TABLET BY MOUTH EVERY DAY     Cardiovascular:  ACEI + Diuretic Combos Failed - 06/25/2022  1:33 AM      Failed - Na in normal range and within 180 days    Sodium  Date Value Ref Range Status  12/26/2021 139 134 - 144 mmol/L Final         Failed - K in normal range and within 180 days    Potassium  Date Value Ref Range Status  12/26/2021 3.9 3.5 - 5.2 mmol/L Final         Failed - Cr in normal range and within 180 days    Creatinine, Ser  Date Value Ref Range Status  12/26/2021 0.74 0.57 - 1.00 mg/dL Final         Failed - eGFR is 30 or above and within 180 days    GFR calc Af Amer  Date Value Ref Range Status  12/21/2019 101 >59 mL/min/1.73 Final   GFR calc non Af Amer  Date Value Ref Range Status  12/21/2019 88 >59 mL/min/1.73 Final   eGFR  Date Value Ref Range Status  12/26/2021 92 >59 mL/min/1.73 Final         Passed - Patient is not pregnant      Passed - Last BP in normal range    BP Readings from Last 1 Encounters:  06/24/22 122/82         Passed - Valid encounter within last 6 months    Recent Outpatient Visits          Yesterday Essential (primary) hypertension   [place] [facility] Care and Sports Medicine at [facility], Jesse Sans, MD   6 months ago Annual physical exam   [place] Primary Care and Sports Medicine at [facility], Jesse Sans, MD   1 year ago Essential (primary) hypertension   [place] Primary Care and Sports Medicine at [facility], Jesse Sans, MD   1 year ago Annual physical exam   [place] Primary Care and Sports Medicine at [facility], Jesse Sans, MD   2 years ago Annual physical exam   [facility] Primary Care and Sports Medicine at [facility], Jesse Sans, MD       Future Appointments            In 6 months Army Melia Jesse Sans, MD [facility] Primary Care and Sports Medicine at [facility], [facility]           . metoprolol succinate (TOPROL-XL) 50 MG 24 hr tablet [Pharmacy Med Name: METOPROLOL SUCC ER 50 MG TAB] 90 tablet 1    Sig: TAKE 1 TABLET BY MOUTH DAILY. TAKE WITH OR IMMEDIATELY FOLLOWING A MEAL.     Cardiovascular:  Beta Blockers Passed - 06/25/2022  1:33 AM      Passed - Last BP in normal range    BP Readings from Last 1 Encounters:  06/24/22 122/82         Passed - Last Heart Rate in normal range    Pulse Readings from Last 1 Encounters:  06/24/22 93         Passed - Valid encounter within last 6 months    Recent Outpatient Visits          Yesterday Essential (primary) hypertension  [place] [facility] Care and Sports Medicine at [facility], Jesse Sans, MD   6 months ago Annual physical exam   [facility] Primary Care and Sports Medicine at [facility], Jesse Sans, MD   1 year ago Essential (primary) hypertension   [place] Primary Care and Sports Medicine at [facility], Jesse Sans, MD   1 year ago Annual physical exam   [facility] Primary Care and Sports Medicine at [facility], Jesse Sans, MD   2 years ago Annual physical exam   [facility] Primary Care and Sports Medicine at [facility], Jesse Sans, MD      Future Appointments            In 6 months Army Melia, Jesse Sans, MD [facility] Primary Care and Sports Medicine at [facility], [facility]

## 2022-09-24 ENCOUNTER — Other Ambulatory Visit: Payer: Self-pay | Admitting: Internal Medicine

## 2022-09-24 DIAGNOSIS — I10 Essential (primary) hypertension: Secondary | ICD-10-CM

## 2022-12-24 ENCOUNTER — Other Ambulatory Visit: Payer: Self-pay | Admitting: Internal Medicine

## 2022-12-24 DIAGNOSIS — I10 Essential (primary) hypertension: Secondary | ICD-10-CM

## 2022-12-30 ENCOUNTER — Ambulatory Visit (INDEPENDENT_AMBULATORY_CARE_PROVIDER_SITE_OTHER): Payer: BC Managed Care – PPO | Admitting: Internal Medicine

## 2022-12-30 ENCOUNTER — Ambulatory Visit
Admission: RE | Admit: 2022-12-30 | Discharge: 2022-12-30 | Disposition: A | Discharge status: Home / Self Care | Payer: BC Managed Care – PPO | Source: Ambulatory Visit | Attending: Internal Medicine | Admitting: Internal Medicine

## 2022-12-30 ENCOUNTER — Ambulatory Visit
Admission: RE | Admit: 2022-12-30 | Discharge: 2022-12-30 | Disposition: A | Discharge status: Home / Self Care | Payer: BC Managed Care – PPO | Attending: Internal Medicine | Admitting: Internal Medicine

## 2022-12-30 ENCOUNTER — Encounter: Payer: Self-pay | Admitting: Internal Medicine

## 2022-12-30 VITALS — BP 112/68 | HR 74 | Ht 62.0 in | Wt 132.2 lb

## 2022-12-30 DIAGNOSIS — G8929 Other chronic pain: Secondary | ICD-10-CM | POA: Insufficient documentation

## 2022-12-30 DIAGNOSIS — Z1211 Encounter for screening for malignant neoplasm of colon: Secondary | ICD-10-CM | POA: Diagnosis not present

## 2022-12-30 DIAGNOSIS — Z1231 Encounter for screening mammogram for malignant neoplasm of breast: Secondary | ICD-10-CM

## 2022-12-30 DIAGNOSIS — Z0000 Encounter for general adult medical examination without abnormal findings: Secondary | ICD-10-CM | POA: Diagnosis not present

## 2022-12-30 DIAGNOSIS — M25551 Pain in right hip: Secondary | ICD-10-CM | POA: Diagnosis present

## 2022-12-30 DIAGNOSIS — I10 Essential (primary) hypertension: Secondary | ICD-10-CM | POA: Diagnosis not present

## 2022-12-30 MED ORDER — MELOXICAM 15 MG PO TABS: ORAL_TABLET | ORAL | 3 refills | Status: DC

## 2022-12-30 NOTE — Progress Notes (Signed)
Date:  12/30/2022   Name:  Sarah Orr   DOB:  21-Aug-1961   MRN:  [id number]   Chief Complaint: Annual Exam Sarah Orr is a 62 y.o. female who presents today for her Complete Annual Exam. She feels well. She reports exercising / walking. She reports she is sleeping well. Breast complaints - none.  Mammogram: 01/2022 DEXA: none Pap smear: 11/2020 neg/neg Colonoscopy: 07/2013 repeat due  Health Maintenance Due  Topic Date Due   DTaP/Tdap/Td (1 - Tdap) Never done   COVID-19 Vaccine (6 - 2023-24 season) 08/20/2022   COLONOSCOPY (Pts 45-46yrs Insurance coverage will need to be confirmed)  09/01/2022    Immunization History  Administered Date(s) Administered   Influenza,inj,Quad PF,6+ Mos 06/10/2017, 06/15/2018, 06/19/2020, 06/09/2021, 06/24/2022   Influenza-Unspecified 06/06/2019   Moderna Sars-Covid-2 Vaccination 11/19/2019, 12/17/2019, 08/01/2020, 05/20/2021   Unspecified SARS-COV-2 Vaccination 06/25/2022   Zoster Recombinat (Shingrix) 12/20/2018, 03/22/2019    Hypertension This is a chronic problem. The problem is controlled. Pertinent negatives include no chest pain, headaches, palpitations or shortness of breath. Past treatments include diuretics, ACE inhibitors and beta blockers. The current treatment provides significant improvement. There is no history of kidney disease, CAD/MI or CVA.  Hip Pain  There was no injury mechanism. The pain is present in the right hip. The quality of the pain is described as aching. The pain is moderate. The pain has been Fluctuating since onset. Associated symptoms include an inability to bear weight (esp walking up steps and uphill). The symptoms are aggravated by weight bearing. She has tried NSAIDs for the symptoms. The treatment provided moderate relief.    Lab Results  Component Value Date   NA 139 12/26/2021   K 3.9 12/26/2021   CO2 25 12/26/2021   GLUCOSE 92 12/26/2021   BUN 13 12/26/2021   CREATININE 0.74 12/26/2021    CALCIUM 9.8 12/26/2021   EGFR 92 12/26/2021   GFRNONAA 88 12/21/2019   Lab Results  Component Value Date   CHOL 182 12/26/2021   HDL 81 12/26/2021   LDLCALC 88 12/26/2021   TRIG 70 12/26/2021   CHOLHDL 2.2 12/26/2021   Lab Results  Component Value Date   TSH 3.190 12/26/2021   Lab Results  Component Value Date   HGBA1C 5.4 12/26/2021   Lab Results  Component Value Date   WBC 5.4 12/26/2021   HGB 14.8 12/26/2021   HCT 43.6 12/26/2021   MCV 94 12/26/2021   PLT 243 12/26/2021   Lab Results  Component Value Date   ALT 17 12/26/2021   AST 21 12/26/2021   ALKPHOS 60 12/26/2021   BILITOT 0.5 12/26/2021   No results found for: "25OHVITD2", "25OHVITD3", "VD25OH"   Review of Systems  Constitutional:  Negative for chills, fatigue and fever.  HENT:  Negative for congestion, hearing loss, tinnitus, trouble swallowing and voice change.   Eyes:  Negative for visual disturbance.  Respiratory:  Negative for cough, chest tightness, shortness of breath and wheezing.   Cardiovascular:  Negative for chest pain, palpitations and leg swelling.  Gastrointestinal:  Negative for abdominal pain, constipation, diarrhea and vomiting.  Endocrine: Negative for polydipsia and polyuria.  Genitourinary:  Negative for dysuria, frequency, genital sores, vaginal bleeding and vaginal discharge.  Musculoskeletal:  Positive for arthralgias (right hip pain for several months). Negative for gait problem and joint swelling.  Skin:  Negative for color change and rash.  Neurological:  Negative for dizziness, tremors, light-headedness and headaches.  Hematological:  Negative for adenopathy.  Does not bruise/bleed easily.  Psychiatric/Behavioral:  Negative for dysphoric mood and sleep disturbance. The patient is not nervous/anxious.     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  Current Meds  Medication Sig   Acetaminophen (TYLENOL PO) Take by mouth as needed.   Ibuprofen (ADVIL PO) Take by mouth as needed.   lisinopril-hydrochlorothiazide (ZESTORETIC) 20-25 MG tablet TAKE 1 TABLET BY MOUTH EVERY DAY   metoprolol succinate (TOPROL-XL) 50 MG 24 hr tablet TAKE 1 TABLET BY MOUTH EVERY DAY WITH OR IMMEDIATELY FOLLOWING A MEAL   [DISCONTINUED] meloxicam (MOBIC) 15 MG tablet TAKE 1 TABLET BY MOUTH DAILY AS NEEDED.       12/30/2022    9:09 AM 06/24/2022    8:24 AM 12/26/2021    9:04 AM 06/09/2021    9:12 AM  GAD 7 : Generalized Anxiety Score  Nervous, Anxious, on Edge 0 1 0 1  Control/stop worrying 0 0 0 0  Worry too much - different things 0 0 0 0  Trouble relaxing 0 0 0 0  Restless 0 0 0 0  Easily annoyed or irritable 0 0 0 0  Afraid - awful might happen 0 0 0 0  Total GAD 7 Score 0 1 0 1  Anxiety Difficulty Not difficult at all Not difficult at all  Not difficult at all       12/30/2022    9:08 AM 06/24/2022    8:24 AM 12/26/2021    9:03 AM  Depression screen PHQ 2/9  Decreased Interest 0 0 0  Down, Depressed, Hopeless 0 0 0  PHQ - 2 Score 0 0 0  Altered sleeping 1 1 1   Tired, decreased energy 1 1 1   Change in appetite 0 0 0  Feeling bad or failure about yourself  0 0 0  Trouble concentrating 0 0 0  Moving slowly or fidgety/restless 0 0 0  Suicidal thoughts 0 0 0  PHQ-9 Score 2 2 2   Difficult doing work/chores Not difficult at all Not difficult at all Not difficult at all     BP Readings from Last 3 Encounters:  12/30/22 112/68  06/24/22 122/82  12/26/21 104/76    Physical Exam Vitals and nursing note reviewed.  Constitutional:      General: She is not in acute distress.    Appearance: She is well-developed.  HENT:     Head: Normocephalic and atraumatic.     Right Ear: Tympanic membrane and ear canal normal.     Left Ear: Tympanic membrane and ear canal normal.     Nose:     Right Sinus: No maxillary sinus tenderness.     Left Sinus: No maxillary sinus tenderness.  Eyes:     General: No scleral icterus.       Right eye:  No discharge.        Left eye: No discharge.     Conjunctiva/sclera: Conjunctivae normal.  Neck:     Thyroid: No thyromegaly.     Vascular: No carotid bruit.  Cardiovascular:     Rate and Rhythm: Normal rate and regular rhythm.     Pulses: Normal pulses.     Heart sounds: Normal heart sounds.  Pulmonary:     Effort: Pulmonary effort is normal. No respiratory distress.     Breath sounds: No wheezing.  Chest:  Breasts:    Right: No mass, nipple discharge, skin change or tenderness.     Left: No mass, nipple discharge, skin change or tenderness.  Abdominal:     General: Bowel sounds are normal.     Palpations: Abdomen is soft.     Tenderness: There is no abdominal tenderness.  Musculoskeletal:     Cervical back: Normal range of motion. No erythema.     Right hip: No tenderness or bony tenderness. Decreased range of motion (decr internal rotation).     Left hip: Normal. No tenderness or bony tenderness. Normal range of motion.     Right lower leg: No edema.     Left lower leg: No edema.  Lymphadenopathy:     Cervical: No cervical adenopathy.  Skin:    General: Skin is warm and dry.     Capillary Refill: Capillary refill takes less than 2 seconds.     Findings: No rash.  Neurological:     General: No focal deficit present.     Mental Status: She is alert and oriented to person, place, and time.     Cranial Nerves:  No cranial nerve deficit.     Sensory: No sensory deficit.     Deep Tendon Reflexes: Reflexes are normal and symmetric.  Psychiatric:        Attention and Perception: Attention normal.        Mood and Affect: Mood normal.     Wt Readings from Last 3 Encounters:  12/30/22 132 lb 3.2 oz (60 kg)  06/24/22 134 lb (60.8 kg)  12/26/21 137 lb (62.1 kg)    BP 112/68   Pulse 74   Ht 5\' 2"  (1.575 m)   Wt 132 lb 3.2 oz (60 kg)   SpO2 99%   BMI 24.18 kg/m   Assessment and Plan:  Problem List Items Addressed This Visit       Cardiovascular and Mediastinum   Essential (primary) hypertension (Chronic)    Stable exam with well controlled BP.  Currently taking lisinopril, hctz and metoprolol. Tolerating medications without concerns or side effects. Will continue to recommend low sodium diet and current regimen.       Relevant Orders   CBC with Differential/Platelet   Comprehensive metabolic panel   TSH   Urinalysis, Routine w reflex microscopic   Other Visit Diagnoses     Annual physical exam    -  Primary   up to date on screening/immunizations continue exercise and healthy diet   Relevant Orders   CBC with Differential/Platelet   Comprehensive metabolic panel   Lipid panel   TSH   Urinalysis, Routine w reflex microscopic   Encounter for screening mammogram for breast cancer       Relevant Orders   MM 3D SCREENING MAMMOGRAM BILATERAL BREAST   Colon cancer screening       due for 10 yr colonoscopy   Relevant Orders   Ambulatory referral to Gastroenterology  Chronic right hip pain       will get imaging continue Mobic PRN   Relevant Medications   meloxicam (MOBIC) 15 MG tablet   Other Relevant Orders   DG Hip Unilat W OR W/O Pelvis 2-3 Views Right       Return in about 6 months (around 07/02/2023) for HTN.   Partially dictated using Dragon software, any errors are not intentional.  Reubin Milan, MD [facility] Medicine [place],  [place]

## 2022-12-30 NOTE — Assessment & Plan Note (Signed)
Stable exam with well controlled BP.  Currently taking lisinopril, hctz and metoprolol. Tolerating medications without concerns or side effects. Will continue to recommend low sodium diet and current regimen.

## 2022-12-30 NOTE — Patient Instructions (Signed)
-  It was a pleasure to see you today! Please review your visit summary for helpful information. -Lab results are usually available within 1-2 days and we will call once reviewed. -I would encourage you to follow your care via MyChart where you can access lab results, notes, messages, and more. -If you feel that we did a nice job today, please complete your after-visit survey and leave us a Google review! Your CMA today was Irianna Gilday and your provider was Dr Laura Berglund, MD.  

## 2022-12-31 LAB — URINALYSIS, ROUTINE W REFLEX MICROSCOPIC
Bilirubin, UA: NEGATIVE
Glucose, UA: NEGATIVE
Ketones, UA: NEGATIVE
Nitrite, UA: NEGATIVE
Protein,UA: NEGATIVE
RBC, UA: NEGATIVE
Specific Gravity, UA: 1.016 (ref 1.005–1.030)
Urobilinogen, Ur: 0.2 mg/dL (ref 0.2–1.0)
pH, UA: 6.5 (ref 5.0–7.5)

## 2022-12-31 LAB — CBC WITH DIFFERENTIAL/PLATELET
Basophils Absolute: 0.1 10*3/uL (ref 0.0–0.2)
Basos: 1 %
EOS (ABSOLUTE): 0 10*3/uL (ref 0.0–0.4)
Eos: 1 %
Hematocrit: 42.7 % (ref 34.0–46.6)
Hemoglobin: 14.4 g/dL (ref 11.1–15.9)
Immature Grans (Abs): 0 10*3/uL (ref 0.0–0.1)
Immature Granulocytes: 0 %
Lymphocytes Absolute: 1.8 10*3/uL (ref 0.7–3.1)
Lymphs: 27 %
MCH: 31.5 pg (ref 26.6–33.0)
MCHC: 33.7 g/dL (ref 31.5–35.7)
MCV: 93 fL (ref 79–97)
Monocytes Absolute: 0.4 10*3/uL (ref 0.1–0.9)
Monocytes: 7 %
Neutrophils Absolute: 4.3 10*3/uL (ref 1.4–7.0)
Neutrophils: 64 %
Platelets: 230 10*3/uL (ref 150–450)
RBC: 4.57 x10E6/uL (ref 3.77–5.28)
RDW: 12.1 % (ref 11.7–15.4)
WBC: 6.7 10*3/uL (ref 3.4–10.8)

## 2022-12-31 LAB — LIPID PANEL
Chol/HDL Ratio: 2.3 ratio (ref 0.0–4.4)
Cholesterol, Total: 199 mg/dL (ref 100–199)
HDL: 85 mg/dL (ref >39)
LDL Chol Calc (NIH): 103 mg/dL — ABNORMAL HIGH (ref 0–99)
Triglycerides: 62 mg/dL (ref 0–149)
VLDL Cholesterol Cal: 11 mg/dL (ref 5–40)

## 2022-12-31 LAB — COMPREHENSIVE METABOLIC PANEL
ALT: 14 IU/L (ref 0–32)
AST: 21 IU/L (ref 0–40)
Albumin/Globulin Ratio: 2 (ref 1.2–2.2)
Albumin: 4.5 g/dL (ref 3.9–4.9)
Alkaline Phosphatase: 62 IU/L (ref 44–121)
BUN/Creatinine Ratio: 23 (ref 12–28)
BUN: 18 mg/dL (ref 8–27)
Bilirubin Total: 0.6 mg/dL (ref 0.0–1.2)
CO2: 23 mmol/L (ref 20–29)
Calcium: 9.8 mg/dL (ref 8.7–10.3)
Chloride: 100 mmol/L (ref 96–106)
Creatinine, Ser: 0.78 mg/dL (ref 0.57–1.00)
Globulin, Total: 2.3 g/dL (ref 1.5–4.5)
Glucose: 95 mg/dL (ref 70–99)
Potassium: 3.9 mmol/L (ref 3.5–5.2)
Sodium: 141 mmol/L (ref 134–144)
Total Protein: 6.8 g/dL (ref 6.0–8.5)
eGFR: 86 mL/min/{1.73_m2} (ref >59)

## 2022-12-31 LAB — MICROSCOPIC EXAMINATION
Bacteria, UA: NONE SEEN
Casts: NONE SEEN /lpf
RBC, Urine: NONE SEEN /hpf (ref 0–2)
WBC, UA: NONE SEEN /hpf (ref 0–5)

## 2022-12-31 LAB — TSH: TSH: 2.57 u[IU]/mL (ref 0.450–4.500)

## 2023-01-04 ENCOUNTER — Telehealth: Payer: Self-pay | Admitting: Internal Medicine

## 2023-01-04 NOTE — Progress Notes (Signed)
Pt will call back to let us know what she decides.  KP

## 2023-01-04 NOTE — Telephone Encounter (Signed)
Faxed imaging results as requested. - Sarah Orr

## 2023-01-04 NOTE — Telephone Encounter (Signed)
Copied from CRM [id number]. Topic: General - Other >> Jan 04, 2023  1:58 PM Sarah Orr wrote: Reason for CRM: Pt states that she was seen last week and had Orr x ray done and is wanting Orr copy of her x ray sent to [facility] Attn: Priscille Heidelberg  [address] [facility] [address] [place], [place] [address]  FAX: [phone] PHONE NMBER:  [phone]

## 2023-01-11 ENCOUNTER — Encounter: Payer: Self-pay | Admitting: *Deleted

## 2023-03-24 ENCOUNTER — Other Ambulatory Visit: Payer: Self-pay | Admitting: Internal Medicine

## 2023-03-24 DIAGNOSIS — I10 Essential (primary) hypertension: Secondary | ICD-10-CM

## 2023-03-24 NOTE — Telephone Encounter (Signed)
Left voice mail to set up follow up for HTN around [facility] November

## 2023-06-19 ENCOUNTER — Other Ambulatory Visit: Payer: Self-pay | Admitting: Internal Medicine

## 2023-06-19 DIAGNOSIS — I10 Essential (primary) hypertension: Secondary | ICD-10-CM

## 2023-07-23 ENCOUNTER — Other Ambulatory Visit: Payer: Self-pay | Admitting: Internal Medicine

## 2023-07-23 DIAGNOSIS — I10 Essential (primary) hypertension: Secondary | ICD-10-CM

## 2023-07-23 NOTE — Telephone Encounter (Signed)
Requested by interface surescripts. Courtesy refill. Future visit in 5 months.  Requested Prescriptions  Pending Prescriptions Disp Refills   metoprolol succinate (TOPROL-XL) 50 MG 24 hr tablet [Pharmacy Med Name: METOPROLOL SUCC ER 50 MG TAB] 90 tablet 1    Sig: TAKE 1 TABLET BY MOUTH EVERY DAY WITH OR IMMEDIATELY FOLLOWING A MEAL     Cardiovascular:  Beta Blockers Failed - 07/23/2023  1:39 AM      Failed - Valid encounter within last 6 months    Recent Outpatient Visits           6 months ago Annual physical exam   [place] [facility] at [facility], Nyoka Cowden, MD   1 year ago Essential (primary) hypertension   [place] [facility] at [facility], Nyoka Cowden, MD   1 year ago Annual physical exam   [facility] at [facility], Nyoka Cowden, MD   2 years ago Essential (primary) hypertension   [place] [facility] at [facility], Nyoka Cowden, MD   2 years ago Annual physical exam   [facility] at [facility] Nyoka Cowden, MD       Future Appointments             In 5 months Judithann Graves Nyoka Cowden, MD [facility] at [facility], [facility]            Passed - Last BP in normal range    BP Readings from Last 1 Encounters:  12/30/22 112/68         Passed - Last Heart Rate in normal range    Pulse Readings from Last 1 Encounters:  12/30/22 74

## 2023-12-30 ENCOUNTER — Other Ambulatory Visit: Payer: Self-pay | Admitting: Internal Medicine

## 2023-12-30 DIAGNOSIS — Z1231 Encounter for screening mammogram for malignant neoplasm of breast: Secondary | ICD-10-CM

## 2024-01-03 ENCOUNTER — Ambulatory Visit (INDEPENDENT_AMBULATORY_CARE_PROVIDER_SITE_OTHER): Payer: Self-pay | Admitting: Internal Medicine

## 2024-01-03 ENCOUNTER — Encounter: Payer: Self-pay | Admitting: Internal Medicine

## 2024-01-03 VITALS — BP 112/74 | HR 72 | Ht 62.0 in | Wt 135.0 lb

## 2024-01-03 DIAGNOSIS — Z1231 Encounter for screening mammogram for malignant neoplasm of breast: Secondary | ICD-10-CM

## 2024-01-03 DIAGNOSIS — Z0000 Encounter for general adult medical examination without abnormal findings: Secondary | ICD-10-CM | POA: Diagnosis not present

## 2024-01-03 DIAGNOSIS — E785 Hyperlipidemia, unspecified: Secondary | ICD-10-CM | POA: Insufficient documentation

## 2024-01-03 DIAGNOSIS — I10 Essential (primary) hypertension: Secondary | ICD-10-CM

## 2024-01-03 DIAGNOSIS — Z131 Encounter for screening for diabetes mellitus: Secondary | ICD-10-CM

## 2024-01-03 DIAGNOSIS — Z1211 Encounter for screening for malignant neoplasm of colon: Secondary | ICD-10-CM | POA: Diagnosis not present

## 2024-01-03 MED ORDER — METOPROLOL SUCCINATE ER 50 MG PO TB24: 50.0000 mg | ORAL_TABLET | Freq: Every day | ORAL | 1 refills | Status: DC

## 2024-01-03 NOTE — Assessment & Plan Note (Signed)
 Controlled with diet and exercise

## 2024-01-03 NOTE — Progress Notes (Signed)
 Date:  01/03/2024   Name:  Sarah Orr   DOB:  1961/07/07   MRN:  [id number]   Chief Complaint: Annual Exam Sarah Orr is a 63 y.o. female who presents today for her Complete Annual Exam. She feels well. She reports exercising walks, 5 - 6 days a week, 30 minutes to 1 hour . She reports she is sleeping well. Breast complaints none.  Health Maintenance  Topic Date Due   DTaP/Tdap/Td vaccine (1 - Tdap) Never done   Colon Cancer Screening  09/01/2022   Mammogram  02/18/2023   COVID-19 Vaccine (6 - 2024-25 season) 05/02/2023   Flu Shot  03/31/2024   Pap with HPV screening  12/24/2025   Hepatitis C Screening  Completed   HIV Screening  Completed   Zoster (Shingles) Vaccine  Completed   HPV Vaccine  Aged Out   Meningitis B Vaccine  Aged Out     Hypertension This is a chronic problem. Pertinent negatives include no chest pain, headaches, palpitations or shortness of breath. Past treatments include beta blockers, angiotensin blockers and diuretics. The current treatment provides significant improvement. There is no history of kidney disease, CAD/MI or CVA.    Review of Systems  Constitutional:  Negative for fatigue and unexpected weight change.  HENT:  Negative for trouble swallowing.   Eyes:  Negative for visual disturbance.  Respiratory:  Negative for cough, chest tightness, shortness of breath and wheezing.   Cardiovascular:  Negative for chest pain, palpitations and leg swelling.  Gastrointestinal:  Negative for abdominal pain, constipation and diarrhea.  Musculoskeletal:  Negative for arthralgias and myalgias.  Neurological:  Negative for dizziness, weakness, light-headedness and headaches.     Lab Results  Component Value Date   NA 141 12/30/2022   K 3.9 12/30/2022   CO2 23 12/30/2022   GLUCOSE 95 12/30/2022   BUN 18 12/30/2022   CREATININE 0.78 12/30/2022   CALCIUM 9.8 12/30/2022   EGFR 86 12/30/2022   GFRNONAA 88 12/21/2019   Lab Results   Component Value Date   CHOL 199 12/30/2022   HDL 85 12/30/2022   LDLCALC 103 (H) 12/30/2022   TRIG 62 12/30/2022   CHOLHDL 2.3 12/30/2022   Lab Results  Component Value Date   TSH 2.570 12/30/2022   Lab Results  Component Value Date   HGBA1C 5.4 12/26/2021   Lab Results  Component Value Date   WBC 6.7 12/30/2022   HGB 14.4 12/30/2022   HCT 42.7 12/30/2022   MCV 93 12/30/2022   PLT 230 12/30/2022   Lab Results  Component Value Date   ALT 14 12/30/2022   AST 21 12/30/2022   ALKPHOS 62 12/30/2022   BILITOT 0.6 12/30/2022   No results found for: "25OHVITD2", "25OHVITD3", "VD25OH"   Patient Active Problem List   Diagnosis Date Noted   Mild hyperlipidemia 01/03/2024   Tinnitus aurium, 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  Current Meds  Medication Sig   Acetaminophen (TYLENOL PO) Take by mouth as needed.   Ibuprofen (ADVIL PO) Take by mouth as needed.   lisinopril -hydrochlorothiazide  (ZESTORETIC ) 20-25 MG tablet TAKE 1 TABLET BY MOUTH EVERY DAY   [DISCONTINUED] meloxicam  (MOBIC ) 15 MG tablet TAKE 1 TABLET BY MOUTH DAILY AS NEEDED.   [DISCONTINUED] metoprolol  succinate (TOPROL -XL) 50 MG 24 hr tablet TAKE 1 TABLET BY MOUTH EVERY DAY WITH OR IMMEDIATELY FOLLOWING A MEAL       01/03/2024    8:48 AM 12/30/2022    9:09 AM 06/24/2022    8:24 AM 12/26/2021    9:04 AM  GAD 7 : Generalized Anxiety Score  Nervous, Anxious, on Edge 1 0 1  0  Control/stop worrying 0 0 0 0  Worry too much - different things 0 0 0 0  Trouble relaxing 0 0 0 0  Restless 0 0 0 0  Easily annoyed or irritable 0 0 0 0  Afraid - awful might happen 0 0 0 0  Total GAD 7 Score 1 0 1 0  Anxiety Difficulty Not difficult at all Not difficult at all Not difficult at all        01/03/2024    8:48 AM 12/30/2022    9:08 AM 06/24/2022    8:24 AM  Depression screen PHQ 2/9  Decreased Interest 0 0 0  Down, Depressed, Hopeless 0 0 0  PHQ - 2 Score 0 0 0  Altered sleeping 1 1 1   Tired, decreased energy 1 1 1   Change in appetite 0 0 0  Feeling bad or failure about yourself  0 0 0  Trouble concentrating 1 0 0  Moving slowly or fidgety/restless 0 0 0  Suicidal thoughts 0 0 0  PHQ-9 Score 3 2 2   Difficult doing work/chores Not difficult at all Not difficult at all Not difficult at all    BP Readings from Last 3 Encounters:  01/03/24 112/74  12/30/22 112/68  06/24/22 122/82    Physical Exam Vitals and nursing note reviewed.  Constitutional:      General: She is not in acute distress.    Appearance: She is well-developed.  HENT:     Head: Normocephalic and atraumatic.     Right Ear: Tympanic membrane and ear canal normal.     Left Ear: Tympanic membrane and ear canal normal.     Nose:     Right Sinus: No maxillary sinus tenderness.     Left Sinus: No maxillary sinus tenderness.  Eyes:     General: No scleral icterus.       Right eye: No discharge.        Left eye: No discharge.     Conjunctiva/sclera: Conjunctivae normal.  Neck:     Thyroid: No thyromegaly.     Vascular: No carotid bruit.  Cardiovascular:     Rate and Rhythm: Normal rate and regular rhythm.     Pulses: Normal pulses.     Heart sounds: Normal heart sounds.  Pulmonary:     Effort: Pulmonary effort is normal. No respiratory distress.     Breath sounds: No wheezing.  Abdominal:     General: Bowel sounds are normal.     Palpations: Abdomen is soft.     Tenderness: There is  no abdominal tenderness.  Musculoskeletal:     Cervical back: Normal range of motion. No erythema.     Right lower leg: No edema.  Left lower leg: No edema.  Lymphadenopathy:     Cervical: No cervical adenopathy.  Skin:    General: Skin is warm and dry.     Findings: No rash.  Neurological:     Mental Status: She is alert and oriented to person, place, and time.     Cranial Nerves: No cranial nerve deficit.     Sensory: No sensory deficit.     Deep Tendon Reflexes: Reflexes are normal and symmetric.  Psychiatric:        Attention and Perception: Attention normal.        Mood and Affect: Mood normal.     Wt Readings from Last 3 Encounters:  01/03/24 135 lb (61.2 kg)  12/30/22 132 lb 3.2 oz (60 kg)  06/24/22 134 lb (60.8 kg)    BP 112/74   Pulse 72   Ht 5\' 2"  (1.575 m)   Wt 135 lb (61.2 kg)   SpO2 95%   BMI 24.69 kg/m   Assessment and Plan:  Problem List Items Addressed This Visit       Unprioritized   Essential (primary) hypertension (Chronic)   Blood pressure is well controlled.  Current medications are lisinopril , hydrochlorothiazide  and metoprolol . Will continue same regimen along with efforts to limit dietary sodium.       Relevant Medications   metoprolol  succinate (TOPROL -XL) 50 MG 24 hr tablet   Other Relevant Orders   CBC with Differential/Platelet   Comprehensive metabolic panel with GFR   TSH   Urinalysis, Routine w reflex microscopic   Mild hyperlipidemia   Controlled with diet and exercise      Relevant Medications   metoprolol  succinate (TOPROL -XL) 50 MG 24 hr tablet   Other Relevant Orders   Lipid panel   Other Visit Diagnoses       Annual physical exam    -  Primary   Relevant Orders   CBC with Differential/Platelet   Comprehensive metabolic panel with GFR   Hemoglobin A1c   Lipid panel   TSH   Urinalysis, Routine w reflex microscopic     Encounter for screening mammogram for breast cancer       scheduled     Colon cancer  screening       Relevant Orders   Ambulatory referral to Gastroenterology     Screening for diabetes mellitus       Relevant Orders   Hemoglobin A1c       Return in about 6 months (around 07/05/2024) for HTN.    Sheron Dixons, MD [facility] Medicine [place]

## 2024-01-03 NOTE — Assessment & Plan Note (Signed)
 Blood pressure is well controlled.  Current medications are lisinopril , hydrochlorothiazide  and metoprolol . Will continue same regimen along with efforts to limit dietary sodium.

## 2024-01-04 ENCOUNTER — Encounter: Payer: Self-pay | Admitting: Internal Medicine

## 2024-01-04 LAB — URINALYSIS, ROUTINE W REFLEX MICROSCOPIC
Bilirubin, UA: NEGATIVE
Glucose, UA: NEGATIVE
Ketones, UA: NEGATIVE
Nitrite, UA: NEGATIVE
Protein,UA: NEGATIVE
RBC, UA: NEGATIVE
Specific Gravity, UA: 1.015 (ref 1.005–1.030)
Urobilinogen, Ur: 0.2 mg/dL (ref 0.2–1.0)
pH, UA: 6.5 (ref 5.0–7.5)

## 2024-01-04 LAB — COMPREHENSIVE METABOLIC PANEL WITH GFR
ALT: 15 IU/L (ref 0–32)
AST: 21 IU/L (ref 0–40)
Albumin: 4.2 g/dL (ref 3.9–4.9)
Alkaline Phosphatase: 61 IU/L (ref 44–121)
BUN/Creatinine Ratio: 24 (ref 12–28)
BUN: 19 mg/dL (ref 8–27)
Bilirubin Total: 0.5 mg/dL (ref 0.0–1.2)
CO2: 24 mmol/L (ref 20–29)
Calcium: 9.6 mg/dL (ref 8.7–10.3)
Chloride: 101 mmol/L (ref 96–106)
Creatinine, Ser: 0.78 mg/dL (ref 0.57–1.00)
Globulin, Total: 2.1 g/dL (ref 1.5–4.5)
Glucose: 88 mg/dL (ref 70–99)
Potassium: 4.1 mmol/L (ref 3.5–5.2)
Sodium: 140 mmol/L (ref 134–144)
Total Protein: 6.3 g/dL (ref 6.0–8.5)
eGFR: 85 mL/min/{1.73_m2} (ref >59)

## 2024-01-04 LAB — CBC WITH DIFFERENTIAL/PLATELET
Basophils Absolute: 0.1 10*3/uL (ref 0.0–0.2)
Basos: 2 %
EOS (ABSOLUTE): 0.1 10*3/uL (ref 0.0–0.4)
Eos: 2 %
Hematocrit: 40.6 % (ref 34.0–46.6)
Hemoglobin: 13.5 g/dL (ref 11.1–15.9)
Immature Grans (Abs): 0 10*3/uL (ref 0.0–0.1)
Immature Granulocytes: 0 %
Lymphocytes Absolute: 1.6 10*3/uL (ref 0.7–3.1)
Lymphs: 30 %
MCH: 31.9 pg (ref 26.6–33.0)
MCHC: 33.3 g/dL (ref 31.5–35.7)
MCV: 96 fL (ref 79–97)
Monocytes Absolute: 0.4 10*3/uL (ref 0.1–0.9)
Monocytes: 7 %
Neutrophils Absolute: 3.2 10*3/uL (ref 1.4–7.0)
Neutrophils: 59 %
Platelets: 224 10*3/uL (ref 150–450)
RBC: 4.23 x10E6/uL (ref 3.77–5.28)
RDW: 12.2 % (ref 11.7–15.4)
WBC: 5.3 10*3/uL (ref 3.4–10.8)

## 2024-01-04 LAB — LIPID PANEL
Chol/HDL Ratio: 2.2 ratio (ref 0.0–4.4)
Cholesterol, Total: 191 mg/dL (ref 100–199)
HDL: 87 mg/dL (ref >39)
LDL Chol Calc (NIH): 92 mg/dL (ref 0–99)
Triglycerides: 63 mg/dL (ref 0–149)
VLDL Cholesterol Cal: 12 mg/dL (ref 5–40)

## 2024-01-04 LAB — HEMOGLOBIN A1C
Est. average glucose Bld gHb Est-mCnc: 108 mg/dL
Hgb A1c MFr Bld: 5.4 % (ref 4.8–5.6)

## 2024-01-04 LAB — TSH: TSH: 3.08 u[IU]/mL (ref 0.450–4.500)

## 2024-01-04 LAB — MICROSCOPIC EXAMINATION
Bacteria, UA: NONE SEEN
Casts: NONE SEEN /LPF
RBC, Urine: NONE SEEN /HPF (ref 0–2)
WBC, UA: NONE SEEN /HPF (ref 0–5)

## 2024-01-12 ENCOUNTER — Ambulatory Visit
Admission: RE | Admit: 2024-01-12 | Discharge: 2024-01-12 | Disposition: A | Discharge status: Home / Self Care | Source: Ambulatory Visit | Attending: Internal Medicine | Admitting: Internal Medicine

## 2024-01-12 DIAGNOSIS — Z1231 Encounter for screening mammogram for malignant neoplasm of breast: Secondary | ICD-10-CM | POA: Insufficient documentation

## 2024-05-29 ENCOUNTER — Encounter: Payer: Self-pay | Admitting: Internal Medicine

## 2024-05-29 ENCOUNTER — Ambulatory Visit (INDEPENDENT_AMBULATORY_CARE_PROVIDER_SITE_OTHER): Admitting: Internal Medicine

## 2024-05-29 ENCOUNTER — Ambulatory Visit: Payer: Self-pay

## 2024-05-29 VITALS — BP 108/64 | HR 78 | Ht 62.0 in | Wt 134.0 lb

## 2024-05-29 DIAGNOSIS — H9192 Unspecified hearing loss, left ear: Secondary | ICD-10-CM | POA: Insufficient documentation

## 2024-05-29 DIAGNOSIS — H9319 Tinnitus, unspecified ear: Secondary | ICD-10-CM | POA: Diagnosis not present

## 2024-05-29 DIAGNOSIS — G509 Disorder of trigeminal nerve, unspecified: Secondary | ICD-10-CM | POA: Insufficient documentation

## 2024-05-29 MED ORDER — GABAPENTIN 100 MG PO CAPS
100.0000 mg | ORAL_CAPSULE | Freq: Two times a day (BID) | ORAL | 0 refills | Status: AC
Start: 2024-05-29 — End: ?

## 2024-05-29 NOTE — Assessment & Plan Note (Signed)
Recommend ENT evaluation

## 2024-05-29 NOTE — Telephone Encounter (Signed)
 This RN attempted to contact patient for triage. No answer, voicemail left requesting return call to clinic.   Copied from CRM [id number]. Topic: Clinical - Medication Question >> May 29, 2024  1:09 PM Shanda MATSU wrote: Reason for CRM: Patient stated that she has been having a burning/hot sensation around her mouth/lip area, she thinks it may be due to her high blood pressure meds, but wants to make sure.

## 2024-05-29 NOTE — Telephone Encounter (Signed)
 FYI Only or Action Required?: FYI only for provider.  Patient was last seen in primary care on 01/03/2024 by Justus Leita DEL, MD.  Called Nurse Triage reporting Numbness.  Symptoms began about a month ago.  Interventions attempted: Other: dentist appointment.  Symptoms are: gradually worsening.  Triage Disposition: See HCP Within 4 Hours (Or PCP Triage)  Patient/caregiver understands and will follow disposition?: Yes  Reason for Disposition  [1] Numbness (i.e., loss of sensation) of the face, arm / hand, or leg / foot on one side of the body AND [2] gradual onset (e.g., days to weeks) AND [3] present now  Answer Assessment - Initial Assessment Questions Patient reports numbness/tingling/burning of her mouth and tongue x 1 month. States she can see a possible canker sore on the left side of her tongue, unsure if related. Denies any other neurological symptoms. States dentist advised her to see PCP.   Is questioning if it could be related to lisinopril . States her mother had a similar issue with lisinopril . States she has been on it 10+ years.  1. SYMPTOM: What is the main symptom you are concerned about? (e.g., weakness, numbness)     Gradual onset oral numbness/tingling/burning sensation  2. ONSET: When did this start? (e.g., minutes, hours, days; while sleeping)     Approximately one month ago  3. LAST NORMAL: When was the last time you (the patient) were normal (no symptoms)?     Approximately one month ago  4. PATTERN Does this come and go, or has it been constant since it started?  Is it present now?     Constant  Protocols used: Neurologic Deficit-A-AH

## 2024-05-29 NOTE — Assessment & Plan Note (Signed)
 Mild numbness and pain but no palsy Will try low dose gabapentin 100 mg bid

## 2024-05-29 NOTE — Patient Instructions (Signed)
 Gabapentin nightly for a week; after one week can increase to twice a day if beneficial. If no benefit after 2-3 weeks you can stop it.

## 2024-05-29 NOTE — Telephone Encounter (Signed)
 Noted  Pt has appt.  KP

## 2024-05-29 NOTE — Assessment & Plan Note (Signed)
 With left sided hearing loss Refer to ENT for evaluation

## 2024-05-29 NOTE — Progress Notes (Signed)
 Date:  05/29/2024   Name:  Sarah Orr   DOB:  [birth year]   MRN:  [id number]   Chief Complaint: Oral Pain (X 1 month mouth burning, and numb. Dentist said they could not find anything wrong so they told her to follow up with PCP. Numbness radiates more on the left side of face/ check/ tongue. )  HPI Numbness and some mild burning pain - on the left side of her face to the side of her mouth and chin and left side of tongue.  Onset about 6 weeks ago but now persistent.  No injury.  Dental checkup normal.    Hearing loss - on left side for more than one year with tinnitus.  Now more bothersome.  Review of Systems  Constitutional:  Negative for appetite change, chills, diaphoresis and fever.  HENT:  Positive for hearing loss. Negative for dental problem and facial swelling.   Respiratory:  Negative for shortness of breath.   Cardiovascular:  Negative for chest pain and palpitations.  Neurological:  Positive for numbness (of left face and left side of tongue).  Hematological:  Negative for adenopathy.  Psychiatric/Behavioral:  Negative for decreased concentration and sleep disturbance. The patient is not nervous/anxious.      Lab Results  Component Value Date   NA 140 01/03/2024   K 4.1 01/03/2024   CO2 24 01/03/2024   GLUCOSE 88 01/03/2024   BUN 19 01/03/2024   CREATININE 0.78 01/03/2024   CALCIUM 9.6 01/03/2024   EGFR 85 01/03/2024   GFRNONAA 88 12/21/2019   Lab Results  Component Value Date   CHOL 191 01/03/2024   HDL 87 01/03/2024   LDLCALC 92 01/03/2024   TRIG 63 01/03/2024   CHOLHDL 2.2 01/03/2024   Lab Results  Component Value Date   TSH 3.080 01/03/2024   Lab Results  Component Value Date   HGBA1C 5.4 01/03/2024   Lab Results  Component Value Date   WBC 5.3 01/03/2024   HGB 13.5 01/03/2024   HCT 40.6 01/03/2024   MCV 96 01/03/2024   PLT 224 01/03/2024   Lab Results  Component Value Date   ALT 15 01/03/2024   AST 21 01/03/2024   ALKPHOS 61  01/03/2024   BILITOT 0.5 01/03/2024   No results found for: MARIEN BOLLS, VD25OH   Patient Active Problem List   Diagnosis Date Noted   Hearing loss of left ear 05/29/2024   Tinnitus 05/29/2024   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   Mild hyperlipidemia 01/03/2024   Tinnitus aurium, 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  Current Meds  Medication Sig   Acetaminophen (TYLENOL PO) Take by mouth as needed.   gabapentin (NEURONTIN) 100 MG capsule Take 1 capsule (100 mg total) by mouth 2 (two) times daily.   Ibuprofen (ADVIL PO) Take by mouth as needed.   lisinopril -hydrochlorothiazide  (  ZESTORETIC ) 20-25 MG tablet TAKE 1 TABLET BY MOUTH EVERY DAY   metoprolol  succinate (TOPROL -XL) 50 MG 24 hr tablet Take 1 tablet (50 mg total) by mouth daily. Take with or immediately following a meal.       05/29/2024    3:36 PM 01/03/2024    8:48 AM 12/30/2022    9:09 AM 06/24/2022    8:24 AM  GAD 7 : Generalized Anxiety Score  Nervous, Anxious, on Edge 0 1 0 1  Control/stop worrying 0 0 0 0  Worry too much - different things 0 0 0 0  Trouble relaxing 0 0 0 0  Restless 0 0 0 0  Easily annoyed or irritable 0 0 0 0  Afraid - awful might happen 0 0 0 0  Total GAD 7 Score 0 1 0 1  Anxiety Difficulty Not difficult at all Not difficult at all Not difficult at all Not difficult at all       05/29/2024    3:36 PM 01/03/2024    8:48 AM  12/30/2022    9:08 AM  Depression screen PHQ 2/9  Decreased Interest 0 0 0  Down, Depressed, Hopeless 0 0 0  PHQ - 2 Score 0 0 0  Altered sleeping 0 1 1  Tired, decreased energy 0 1 1  Change in appetite 0 0 0  Feeling bad or failure about yourself  0 0 0  Trouble concentrating 0 1 0  Moving slowly or fidgety/restless 0 0 0  Suicidal thoughts 0 0 0  PHQ-9 Score 0 3 2  Difficult doing work/chores Not difficult at all Not difficult at all Not difficult at all    BP Readings from Last 3 Encounters:  05/29/24 108/64  01/03/24 112/74  12/30/22 112/68    Physical Exam Vitals and nursing note reviewed.  Constitutional:      General: She is not in acute distress.    Appearance: Normal appearance. She is well-developed.  HENT:     Head: Normocephalic and atraumatic.     Right Ear: Tympanic membrane and ear canal normal.     Left Ear: Tympanic membrane and ear canal normal.  Eyes:     Pupils: Pupils are equal, round, and reactive to light.  Cardiovascular:     Rate and Rhythm: Normal rate and regular rhythm.  Pulmonary:     Effort: Pulmonary effort is normal. No respiratory distress.     Breath sounds: No wheezing or rhonchi.  Musculoskeletal:     Cervical back: Normal range of motion.  Lymphadenopathy:     Cervical: No cervical adenopathy.  Skin:    General: Skin is warm and dry.     Findings: No rash.  Neurological:     Mental Status: She is alert and oriented to person, place, and time.     Cranial Nerves: Cranial nerve deficit present. No dysarthria or facial asymmetry.     Sensory: Sensory deficit (decreased sensation along CN V on left - mostly distal around mouth and chin) present.     Motor: No weakness or atrophy.     Coordination: Coordination is intact.     Gait: Gait is intact.  Psychiatric:        Mood and Affect: Mood normal.        Behavior: Behavior normal.     Wt Readings from Last 3 Encounters:  05/29/24 134 lb (60.8 kg)  01/03/24 135 lb (61.2 kg)   12/30/22 132 lb 3.2 oz (60 kg)  BP 108/64   Pulse 78   Ht 5' 2 (1.575 m)   Wt 134 lb (60.8 kg)   SpO2 98%   BMI 24.51 kg/m   Assessment and Plan:  Problem List Items Addressed This Visit       Unprioritized   Disorder of the fifth cranial nerve   Mild numbness and pain but no palsy Will try low dose gabapentin 100 mg bid       Relevant Medications   gabapentin (NEURONTIN) 100 MG capsule   Hearing loss of left ear - Primary   Recommend ENT evaluation      Relevant Orders   Ambulatory referral to ENT   Tinnitus   With left sided hearing loss Refer to ENT for evaluation      Relevant Orders   Ambulatory referral to ENT    No follow-ups on file.    Leita HILARIO Adie, MD [facility] [place]

## 2024-06-15 ENCOUNTER — Other Ambulatory Visit: Payer: Self-pay | Admitting: Internal Medicine

## 2024-06-15 DIAGNOSIS — I10 Essential (primary) hypertension: Secondary | ICD-10-CM

## 2024-06-16 NOTE — Telephone Encounter (Signed)
 Requested Prescriptions  Pending Prescriptions Disp Refills   lisinopril -hydrochlorothiazide  (ZESTORETIC ) 20-25 MG tablet [Pharmacy Med Name: LISINOPRIL -HCTZ 20-25 MG TAB] 90 tablet 0    Sig: TAKE 1 TABLET BY MOUTH EVERY DAY     Cardiovascular:  ACEI + Diuretic Combos Passed - 06/16/2024  1:40 PM      Passed - Na in normal range and within 180 days    Sodium  Date Value Ref Range Status  01/03/2024 140 134 - 144 mmol/L Final         Passed - K in normal range and within 180 days    Potassium  Date Value Ref Range Status  01/03/2024 4.1 3.5 - 5.2 mmol/L Final         Passed - Cr in normal range and within 180 days    Creatinine, Ser  Date Value Ref Range Status  01/03/2024 0.78 0.57 - 1.00 mg/dL Final         Passed - eGFR is 30 or above and within 180 days    GFR calc Af Amer  Date Value Ref Range Status  12/21/2019 101 >59 mL/min/1.73 Final   GFR calc non Af Amer  Date Value Ref Range Status  12/21/2019 88 >59 mL/min/1.73 Final   eGFR  Date Value Ref Range Status  01/03/2024 85 >59 mL/min/1.73 Final         Passed - Patient is not pregnant      Passed - Last BP in normal range    BP Readings from Last 1 Encounters:  05/29/24 108/64         Passed - Valid encounter within last 6 months    Recent Outpatient Visits           2 weeks ago Hearing loss of left ear, unspecified hearing loss type   [facility] at [facility], Leita DEL, MD   5 months ago Annual physical exam   [facility] at [facility], Leita DEL, MD

## 2024-06-21 ENCOUNTER — Other Ambulatory Visit: Payer: Self-pay | Admitting: Otolaryngology

## 2024-06-21 DIAGNOSIS — H90A22 Sensorineural hearing loss, unilateral, left ear, with restricted hearing on the contralateral side: Secondary | ICD-10-CM

## 2024-06-30 ENCOUNTER — Other Ambulatory Visit: Payer: Self-pay | Admitting: Internal Medicine

## 2024-06-30 DIAGNOSIS — I10 Essential (primary) hypertension: Secondary | ICD-10-CM

## 2024-07-01 NOTE — Telephone Encounter (Signed)
 Requested Prescriptions  Pending Prescriptions Disp Refills   metoprolol  succinate (TOPROL -XL) 50 MG 24 hr tablet [Pharmacy Med Name: METOPROLOL  SUCC ER 50 MG TAB] 90 tablet 0    Sig: TAKE 1 TABLET BY MOUTH DAILY. TAKE WITH OR IMMEDIATELY FOLLOWING A MEAL.     Cardiovascular:  Beta Blockers Passed - 07/01/2024  8:49 AM      Passed - Last BP in normal range    BP Readings from Last 1 Encounters:  05/29/24 108/64         Passed - Last Heart Rate in normal range    Pulse Readings from Last 1 Encounters:  05/29/24 78         Passed - Valid encounter within last 6 months    Recent Outpatient Visits           1 month ago Hearing loss of left ear, unspecified hearing loss type   [facility] at [facility], Leita DEL, MD   6 months ago Annual physical exam   [facility] at [facility], Leita DEL, MD

## 2024-07-04 ENCOUNTER — Inpatient Hospital Stay
Admission: RE | Admit: 2024-07-04 | Discharge: 2024-07-04 | Disposition: A | Source: Ambulatory Visit | Attending: Otolaryngology | Admitting: Otolaryngology

## 2024-07-04 DIAGNOSIS — H90A22 Sensorineural hearing loss, unilateral, left ear, with restricted hearing on the contralateral side: Secondary | ICD-10-CM

## 2024-07-04 MED ORDER — GADOPICLENOL 0.5 MMOL/ML IV SOLN
7.5000 mL | Freq: Once | INTRAVENOUS | Status: AC | PRN
  Administered 2024-07-04: 6 mL via INTRAVENOUS

## 2024-07-05 ENCOUNTER — Ambulatory Visit: Admitting: Internal Medicine
